# Patient Record
Sex: Female | Born: 1970 | Race: White | Hispanic: No | Smoking: Former smoker
Health system: Southern US, Community
[De-identification: ages and names within clinical notes are randomized; demographics above are authoritative.]

## PROBLEM LIST (undated history)

## (undated) DIAGNOSIS — G5600 Carpal tunnel syndrome, unspecified upper limb: Secondary | ICD-10-CM

## (undated) DIAGNOSIS — F419 Anxiety disorder, unspecified: Secondary | ICD-10-CM

## (undated) DIAGNOSIS — K219 Gastro-esophageal reflux disease without esophagitis: Secondary | ICD-10-CM

## (undated) HISTORY — PX: ESOPHAGEAL DILATION: SHX303

## (undated) HISTORY — DX: Carpal tunnel syndrome, unspecified upper limb: G56.00

## (undated) HISTORY — DX: Gastro-esophageal reflux disease without esophagitis: K21.9

## (undated) HISTORY — PX: LEEP: SHX91

## (undated) HISTORY — PX: ESOPHAGEAL MANOMETRY: SHX1526

## (undated) HISTORY — DX: Anxiety disorder, unspecified: F41.9

---

## 1998-11-19 ENCOUNTER — Inpatient Hospital Stay (HOSPITAL_COMMUNITY): Admission: AD | Admit: 1998-11-19 | Discharge: 1998-11-19 | Payer: Self-pay | Admitting: Obstetrics and Gynecology

## 1998-11-30 ENCOUNTER — Inpatient Hospital Stay (HOSPITAL_COMMUNITY): Admission: AD | Admit: 1998-11-30 | Discharge: 1998-12-02 | Payer: Self-pay | Admitting: Obstetrics and Gynecology

## 1998-11-30 ENCOUNTER — Encounter (INDEPENDENT_AMBULATORY_CARE_PROVIDER_SITE_OTHER): Payer: Self-pay

## 1999-01-05 ENCOUNTER — Other Ambulatory Visit: Admission: RE | Admit: 1999-01-05 | Discharge: 1999-01-05 | Payer: Self-pay | Admitting: Obstetrics and Gynecology

## 2000-01-30 ENCOUNTER — Other Ambulatory Visit: Admission: RE | Admit: 2000-01-30 | Discharge: 2000-01-30 | Payer: Self-pay | Admitting: Family Medicine

## 2001-06-28 ENCOUNTER — Other Ambulatory Visit: Admission: RE | Admit: 2001-06-28 | Discharge: 2001-06-28 | Payer: Self-pay | Admitting: Family Medicine

## 2001-08-05 ENCOUNTER — Encounter: Admission: RE | Admit: 2001-08-05 | Discharge: 2001-08-05 | Payer: Self-pay | Admitting: Family Medicine

## 2001-08-05 ENCOUNTER — Encounter: Payer: Self-pay | Admitting: Family Medicine

## 2003-02-27 ENCOUNTER — Other Ambulatory Visit: Admission: RE | Admit: 2003-02-27 | Discharge: 2003-02-27 | Payer: Self-pay | Admitting: Family Medicine

## 2003-08-02 ENCOUNTER — Inpatient Hospital Stay (HOSPITAL_COMMUNITY): Admission: AD | Admit: 2003-08-02 | Discharge: 2003-08-03 | Payer: Self-pay | Admitting: Obstetrics and Gynecology

## 2004-02-08 ENCOUNTER — Other Ambulatory Visit: Admission: RE | Admit: 2004-02-08 | Discharge: 2004-02-08 | Payer: Self-pay | Admitting: Family Medicine

## 2005-09-15 ENCOUNTER — Ambulatory Visit: Payer: Self-pay | Admitting: Family Medicine

## 2005-09-29 ENCOUNTER — Ambulatory Visit: Payer: Self-pay | Admitting: Family Medicine

## 2005-09-29 ENCOUNTER — Encounter: Payer: Self-pay | Admitting: Family Medicine

## 2005-09-29 ENCOUNTER — Other Ambulatory Visit: Admission: RE | Admit: 2005-09-29 | Discharge: 2005-09-29 | Payer: Self-pay | Admitting: Family Medicine

## 2005-12-21 ENCOUNTER — Ambulatory Visit: Payer: Self-pay | Admitting: Family Medicine

## 2005-12-22 ENCOUNTER — Ambulatory Visit: Payer: Self-pay | Admitting: Gastroenterology

## 2006-01-05 ENCOUNTER — Ambulatory Visit: Payer: Self-pay | Admitting: Family Medicine

## 2006-06-18 ENCOUNTER — Encounter: Payer: Self-pay | Admitting: Internal Medicine

## 2006-06-21 ENCOUNTER — Ambulatory Visit: Payer: Self-pay | Admitting: Internal Medicine

## 2006-10-05 ENCOUNTER — Encounter: Payer: Self-pay | Admitting: Family Medicine

## 2006-12-06 ENCOUNTER — Ambulatory Visit: Payer: Self-pay | Admitting: Family Medicine

## 2006-12-06 ENCOUNTER — Encounter: Payer: Self-pay | Admitting: Family Medicine

## 2006-12-06 ENCOUNTER — Other Ambulatory Visit: Admission: RE | Admit: 2006-12-06 | Discharge: 2006-12-06 | Payer: Self-pay | Admitting: Family Medicine

## 2006-12-06 DIAGNOSIS — F411 Generalized anxiety disorder: Secondary | ICD-10-CM | POA: Insufficient documentation

## 2006-12-06 DIAGNOSIS — G43009 Migraine without aura, not intractable, without status migrainosus: Secondary | ICD-10-CM | POA: Insufficient documentation

## 2006-12-06 DIAGNOSIS — K219 Gastro-esophageal reflux disease without esophagitis: Secondary | ICD-10-CM

## 2006-12-06 LAB — CONVERTED CEMR LAB
Bilirubin Urine: NEGATIVE
Glucose, Urine, Semiquant: NEGATIVE
Ketones, urine, test strip: NEGATIVE
Nitrite: NEGATIVE
Pap Smear: NORMAL
Protein, U semiquant: NEGATIVE
Specific Gravity, Urine: 1.015
Urobilinogen, UA: NEGATIVE
WBC Urine, dipstick: NEGATIVE
pH: 7.5

## 2006-12-13 LAB — CONVERTED CEMR LAB
ALT: 19 units/L (ref 0–35)
AST: 16 units/L (ref 0–37)
Albumin: 3.8 g/dL (ref 3.5–5.2)
Alkaline Phosphatase: 48 units/L (ref 39–117)
BUN: 12 mg/dL (ref 6–23)
Basophils Absolute: 0.1 10*3/uL (ref 0.0–0.1)
Basophils Relative: 0.8 % (ref 0.0–1.0)
Bilirubin, Direct: 0.1 mg/dL (ref 0.0–0.3)
CO2: 32 meq/L (ref 19–32)
Calcium: 9.1 mg/dL (ref 8.4–10.5)
Chloride: 104 meq/L (ref 96–112)
Cholesterol: 135 mg/dL (ref 0–200)
Creatinine, Ser: 0.8 mg/dL (ref 0.4–1.2)
Eosinophils Absolute: 0.2 10*3/uL (ref 0.0–0.6)
Eosinophils Relative: 2.9 % (ref 0.0–5.0)
GFR calc Af Amer: 104 mL/min
GFR calc non Af Amer: 86 mL/min
Glucose, Bld: 86 mg/dL (ref 70–99)
HCT: 34.3 % — ABNORMAL LOW (ref 36.0–46.0)
HDL: 41.5 mg/dL (ref >39.0)
Hemoglobin: 12 g/dL (ref 12.0–15.0)
LDL Cholesterol: 77 mg/dL (ref 0–99)
Lymphocytes Relative: 20.4 % (ref 12.0–46.0)
MCHC: 34.9 g/dL (ref 30.0–36.0)
MCV: 84.6 fL (ref 78.0–100.0)
Monocytes Absolute: 0.7 10*3/uL (ref 0.2–0.7)
Monocytes Relative: 8.7 % (ref 3.0–11.0)
Neutro Abs: 5.4 10*3/uL (ref 1.4–7.7)
Neutrophils Relative %: 67.2 % (ref 43.0–77.0)
Platelets: 316 10*3/uL (ref 150–400)
Potassium: 4.1 meq/L (ref 3.5–5.1)
RBC: 4.05 M/uL (ref 3.87–5.11)
RDW: 12.6 % (ref 11.5–14.6)
Sodium: 141 meq/L (ref 135–145)
TSH: 0.94 microintl units/mL (ref 0.35–5.50)
Total Bilirubin: 0.7 mg/dL (ref 0.3–1.2)
Total CHOL/HDL Ratio: 3.3
Total Protein: 6.7 g/dL (ref 6.0–8.3)
Triglycerides: 81 mg/dL (ref 0–149)
VLDL: 16 mg/dL (ref 0–40)
WBC: 8.1 10*3/uL (ref 4.5–10.5)

## 2007-01-28 ENCOUNTER — Telehealth (INDEPENDENT_AMBULATORY_CARE_PROVIDER_SITE_OTHER): Payer: Self-pay | Admitting: *Deleted

## 2007-01-28 ENCOUNTER — Ambulatory Visit: Payer: Self-pay | Admitting: Family Medicine

## 2007-01-28 DIAGNOSIS — R209 Unspecified disturbances of skin sensation: Secondary | ICD-10-CM | POA: Insufficient documentation

## 2007-01-30 ENCOUNTER — Encounter: Payer: Self-pay | Admitting: Family Medicine

## 2007-01-31 ENCOUNTER — Telehealth (INDEPENDENT_AMBULATORY_CARE_PROVIDER_SITE_OTHER): Payer: Self-pay | Admitting: *Deleted

## 2007-02-01 ENCOUNTER — Encounter: Payer: Self-pay | Admitting: Internal Medicine

## 2007-02-04 ENCOUNTER — Encounter: Payer: Self-pay | Admitting: Family Medicine

## 2007-05-01 ENCOUNTER — Encounter: Payer: Self-pay | Admitting: Internal Medicine

## 2007-05-02 ENCOUNTER — Ambulatory Visit: Payer: Self-pay | Admitting: Internal Medicine

## 2007-05-02 DIAGNOSIS — R079 Chest pain, unspecified: Secondary | ICD-10-CM | POA: Insufficient documentation

## 2007-05-02 DIAGNOSIS — G5600 Carpal tunnel syndrome, unspecified upper limb: Secondary | ICD-10-CM

## 2007-05-02 DIAGNOSIS — K219 Gastro-esophageal reflux disease without esophagitis: Secondary | ICD-10-CM

## 2007-05-02 DIAGNOSIS — G43909 Migraine, unspecified, not intractable, without status migrainosus: Secondary | ICD-10-CM | POA: Insufficient documentation

## 2007-05-03 LAB — CONVERTED CEMR LAB: Troponin I: 0.03 ng/mL (ref <0.06)

## 2007-05-04 LAB — CONVERTED CEMR LAB
CK-MB: 1.7 ng/mL (ref 0.3–4.0)
Total CK: 73 units/L (ref 7–177)

## 2007-05-21 ENCOUNTER — Ambulatory Visit: Payer: Self-pay | Admitting: Internal Medicine

## 2007-05-21 ENCOUNTER — Encounter (INDEPENDENT_AMBULATORY_CARE_PROVIDER_SITE_OTHER): Payer: Self-pay | Admitting: *Deleted

## 2007-07-04 ENCOUNTER — Telehealth (INDEPENDENT_AMBULATORY_CARE_PROVIDER_SITE_OTHER): Payer: Self-pay | Admitting: *Deleted

## 2007-11-08 ENCOUNTER — Ambulatory Visit: Payer: Self-pay | Admitting: *Deleted

## 2008-06-18 ENCOUNTER — Ambulatory Visit: Payer: Self-pay | Admitting: Family Medicine

## 2008-06-18 ENCOUNTER — Encounter: Payer: Self-pay | Admitting: Family Medicine

## 2008-06-18 ENCOUNTER — Other Ambulatory Visit: Admission: RE | Admit: 2008-06-18 | Discharge: 2008-06-18 | Payer: Self-pay | Admitting: Family Medicine

## 2008-06-18 LAB — CONVERTED CEMR LAB
Bilirubin Urine: NEGATIVE
Blood in Urine, dipstick: NEGATIVE
Glucose, Urine, Semiquant: NEGATIVE
Ketones, urine, test strip: NEGATIVE
Nitrite: NEGATIVE
Protein, U semiquant: NEGATIVE
Specific Gravity, Urine: <1.005
Urobilinogen, UA: NEGATIVE
WBC Urine, dipstick: NEGATIVE
pH: 7

## 2008-06-22 ENCOUNTER — Encounter (INDEPENDENT_AMBULATORY_CARE_PROVIDER_SITE_OTHER): Payer: Self-pay | Admitting: *Deleted

## 2008-06-22 ENCOUNTER — Telehealth: Payer: Self-pay | Admitting: Family Medicine

## 2008-06-22 LAB — CONVERTED CEMR LAB
ALT: 20 units/L (ref 0–35)
AST: 21 units/L (ref 0–37)
Albumin: 3.8 g/dL (ref 3.5–5.2)
Alkaline Phosphatase: 50 units/L (ref 39–117)
BUN: 12 mg/dL (ref 6–23)
Basophils Absolute: 0.1 10*3/uL (ref 0.0–0.1)
Basophils Relative: 1 % (ref 0.0–3.0)
Bilirubin, Direct: 0.1 mg/dL (ref 0.0–0.3)
CO2: 30 meq/L (ref 19–32)
Calcium: 8.8 mg/dL (ref 8.4–10.5)
Chloride: 105 meq/L (ref 96–112)
Cholesterol: 152 mg/dL (ref 0–200)
Creatinine, Ser: 0.9 mg/dL (ref 0.4–1.2)
Eosinophils Absolute: 0.2 10*3/uL (ref 0.0–0.7)
Eosinophils Relative: 3.4 % (ref 0.0–5.0)
GFR calc Af Amer: 90 mL/min
GFR calc non Af Amer: 74 mL/min
Glucose, Bld: 88 mg/dL (ref 70–99)
HCT: 30.6 % — ABNORMAL LOW (ref 36.0–46.0)
HDL: 55 mg/dL (ref >39.0)
Hemoglobin: 9.8 g/dL — ABNORMAL LOW (ref 12.0–15.0)
LDL Cholesterol: 87 mg/dL (ref 0–99)
Lymphocytes Relative: 22.7 % (ref 12.0–46.0)
MCHC: 32.2 g/dL (ref 30.0–36.0)
MCV: 74.6 fL — ABNORMAL LOW (ref 78.0–100.0)
Monocytes Absolute: 0.7 10*3/uL (ref 0.1–1.0)
Monocytes Relative: 12 % (ref 3.0–12.0)
Neutro Abs: 3.5 10*3/uL (ref 1.4–7.7)
Neutrophils Relative %: 60.9 % (ref 43.0–77.0)
Platelets: 269 10*3/uL (ref 150–400)
Potassium: 4.2 meq/L (ref 3.5–5.1)
RBC: 4.1 M/uL (ref 3.87–5.11)
RDW: 14.5 % (ref 11.5–14.6)
Sodium: 141 meq/L (ref 135–145)
TSH: 1.67 microintl units/mL (ref 0.35–5.50)
Total Bilirubin: 0.5 mg/dL (ref 0.3–1.2)
Total CHOL/HDL Ratio: 2.8
Total Protein: 6.7 g/dL (ref 6.0–8.3)
Triglycerides: 52 mg/dL (ref 0–149)
VLDL: 10 mg/dL (ref 0–40)
WBC: 5.8 10*3/uL (ref 4.5–10.5)

## 2008-09-15 ENCOUNTER — Telehealth (INDEPENDENT_AMBULATORY_CARE_PROVIDER_SITE_OTHER): Payer: Self-pay | Admitting: *Deleted

## 2008-12-01 ENCOUNTER — Telehealth: Payer: Self-pay | Admitting: Family Medicine

## 2008-12-03 ENCOUNTER — Ambulatory Visit: Payer: Self-pay | Admitting: Family Medicine

## 2008-12-03 DIAGNOSIS — D649 Anemia, unspecified: Secondary | ICD-10-CM

## 2008-12-09 ENCOUNTER — Encounter (INDEPENDENT_AMBULATORY_CARE_PROVIDER_SITE_OTHER): Payer: Self-pay | Admitting: *Deleted

## 2008-12-09 LAB — CONVERTED CEMR LAB
Basophils Relative: 1.4 % (ref 0.0–3.0)
Eosinophils Relative: 1.6 % (ref 0.0–5.0)
Ferritin: 7.7 ng/mL — ABNORMAL LOW (ref 10.0–291.0)
Hemoglobin: 12.6 g/dL (ref 12.0–15.0)
Lymphocytes Relative: 23.5 % (ref 12.0–46.0)
MCHC: 33.8 g/dL (ref 30.0–36.0)
Monocytes Relative: 10 % (ref 3.0–12.0)
Neutro Abs: 3.6 10*3/uL (ref 1.4–7.7)
RBC: 4.4 M/uL (ref 3.87–5.11)
Saturation Ratios: 16.2 % — ABNORMAL LOW (ref 20.0–50.0)
Transferrin: 251.2 mg/dL (ref 212.0–360.0)
WBC: 5.8 10*3/uL (ref 4.5–10.5)

## 2008-12-11 ENCOUNTER — Telehealth (INDEPENDENT_AMBULATORY_CARE_PROVIDER_SITE_OTHER): Payer: Self-pay | Admitting: *Deleted

## 2008-12-14 ENCOUNTER — Telehealth (INDEPENDENT_AMBULATORY_CARE_PROVIDER_SITE_OTHER): Payer: Self-pay | Admitting: *Deleted

## 2009-06-16 ENCOUNTER — Ambulatory Visit: Payer: Self-pay | Admitting: Family Medicine

## 2009-06-16 DIAGNOSIS — H10029 Other mucopurulent conjunctivitis, unspecified eye: Secondary | ICD-10-CM | POA: Insufficient documentation

## 2009-06-16 DIAGNOSIS — J019 Acute sinusitis, unspecified: Secondary | ICD-10-CM

## 2009-07-05 ENCOUNTER — Telehealth: Payer: Self-pay | Admitting: Family Medicine

## 2009-07-20 ENCOUNTER — Ambulatory Visit: Payer: Self-pay | Admitting: Family Medicine

## 2009-07-20 ENCOUNTER — Telehealth (INDEPENDENT_AMBULATORY_CARE_PROVIDER_SITE_OTHER): Payer: Self-pay | Admitting: *Deleted

## 2009-07-20 DIAGNOSIS — N920 Excessive and frequent menstruation with regular cycle: Secondary | ICD-10-CM | POA: Insufficient documentation

## 2009-07-20 DIAGNOSIS — J039 Acute tonsillitis, unspecified: Secondary | ICD-10-CM | POA: Insufficient documentation

## 2009-07-22 ENCOUNTER — Telehealth: Payer: Self-pay | Admitting: Family Medicine

## 2009-07-22 LAB — CONVERTED CEMR LAB
AST: 34 units/L (ref 0–37)
Albumin: 3.9 g/dL (ref 3.5–5.2)
Alkaline Phosphatase: 57 units/L (ref 39–117)
BUN: 8 mg/dL (ref 6–23)
Basophils Relative: 0.8 % (ref 0.0–3.0)
Bilirubin, Direct: 0.1 mg/dL (ref 0.0–0.3)
CO2: 31 meq/L (ref 19–32)
Calcium: 9.1 mg/dL (ref 8.4–10.5)
Chloride: 100 meq/L (ref 96–112)
Creatinine, Ser: 0.7 mg/dL (ref 0.4–1.2)
Hemoglobin: 12.8 g/dL (ref 12.0–15.0)
Lymphocytes Relative: 43 % (ref 12.0–46.0)
Mono Screen: NEGATIVE
Monocytes Relative: 10.8 % (ref 3.0–12.0)
Neutro Abs: 1.7 10*3/uL (ref 1.4–7.7)
Neutrophils Relative %: 43.2 % (ref 43.0–77.0)
RBC: 4.39 M/uL (ref 3.87–5.11)
Total Protein: 7.4 g/dL (ref 6.0–8.3)
WBC: 4 10*3/uL — ABNORMAL LOW (ref 4.5–10.5)

## 2009-09-30 ENCOUNTER — Telehealth (INDEPENDENT_AMBULATORY_CARE_PROVIDER_SITE_OTHER): Payer: Self-pay | Admitting: *Deleted

## 2009-09-30 ENCOUNTER — Ambulatory Visit: Payer: Self-pay | Admitting: Family Medicine

## 2009-09-30 ENCOUNTER — Encounter (INDEPENDENT_AMBULATORY_CARE_PROVIDER_SITE_OTHER): Payer: Self-pay | Admitting: *Deleted

## 2009-09-30 ENCOUNTER — Ambulatory Visit: Payer: Self-pay | Admitting: Cardiology

## 2009-09-30 DIAGNOSIS — R51 Headache: Secondary | ICD-10-CM

## 2009-09-30 DIAGNOSIS — R519 Headache, unspecified: Secondary | ICD-10-CM | POA: Insufficient documentation

## 2009-10-02 ENCOUNTER — Encounter: Payer: Self-pay | Admitting: Family Medicine

## 2009-10-04 LAB — CONVERTED CEMR LAB
Basophils Absolute: 0 10*3/uL (ref 0.0–0.1)
CO2: 30 meq/L (ref 19–32)
Calcium: 9.3 mg/dL (ref 8.4–10.5)
Creatinine, Ser: 0.7 mg/dL (ref 0.4–1.2)
Eosinophils Absolute: 0 10*3/uL (ref 0.0–0.7)
GFR calc non Af Amer: 105.17 mL/min (ref >60)
Glucose, Bld: 100 mg/dL — ABNORMAL HIGH (ref 70–99)
Hemoglobin: 13 g/dL (ref 12.0–15.0)
Lymphocytes Relative: 26.9 % (ref 12.0–46.0)
MCHC: 34 g/dL (ref 30.0–36.0)
Monocytes Relative: 13.1 % — ABNORMAL HIGH (ref 3.0–12.0)
Neutro Abs: 2.5 10*3/uL (ref 1.4–7.7)
Neutrophils Relative %: 59 % (ref 43.0–77.0)
RDW: 14.1 % (ref 11.5–14.6)
Sed Rate: 16 mm/hr (ref 0–22)
Sodium: 142 meq/L (ref 135–145)

## 2009-10-07 ENCOUNTER — Telehealth: Payer: Self-pay | Admitting: Family Medicine

## 2010-05-08 ENCOUNTER — Encounter: Payer: Self-pay | Admitting: Family Medicine

## 2010-05-19 NOTE — Progress Notes (Signed)
Summary: lmom 6/17  Phone Note From Other Clinic   Caller: Rose --CT Call For: lowne Summary of Call: CT head--sphenoid sinusitis,  no acute intracranial abnormality-- Initial call taken by: Loreen Freud DO,  September 30, 2009 5:25 PM  Follow-up for Phone Call        augmentin sent to pharmacy--- pt instructed to go to ER if she got no relief with vicodin--pt understands. Follow-up by: Loreen Freud DO,  September 30, 2009 5:26 PM  Additional Follow-up for Phone Call Additional follow up Details #1::        Please call pt tomorrow---late morning , early afternoon---to see how she is feeling. Additional Follow-up by: Loreen Freud DO,  September 30, 2009 5:26 PM    Additional Follow-up for Phone Call Additional follow up Details #2::    Called pt to check on her, had to leave a message. Army Fossa CMA  October 01, 2009 1:30 PM

## 2010-05-19 NOTE — Letter (Signed)
Summary: Call a Nurse  Call a Nurse   Imported By: Lanelle Bal 10/14/2009 13:52:49  _____________________________________________________________________  External Attachment:    Type:   Image     Comment:   External Document

## 2010-05-19 NOTE — Assessment & Plan Note (Signed)
Summary: SORE THROAT/KDC   Vital Signs:  Patient profile:   40 year old female Weight:      195 pounds Temp:     98.4 degrees F oral Pulse rate:   87 / minute Pulse rhythm:   regular BP sitting:   124 / 80  (left arm) Cuff size:   regular  Vitals Entered By: Army Fossa CMA (July 20, 2009 3:32 PM) CC: Pt here for sore throat, was seen a month ago and given an ATB, and now the sore thorat has returned., URI symptoms   History of Present Illness:       This is a 40 year old woman who presents with URI symptoms.  The symptoms began 4 weeks ago.  Pt did fell better after last tx for 3 days and than everything came back.  The patient complains of sore throat, but denies nasal congestion, clear nasal discharge, purulent nasal discharge, dry cough, productive cough, earache, and sick contacts.  The patient denies fever, low-grade fever (<100.5 degrees), fever of 100.5-103 degrees, fever of 103.1-104 degrees, fever to >104 degrees, stiff neck, dyspnea, wheezing, rash, vomiting, diarrhea, use of an antipyretic, and response to antipyretic.  The patient also reports headache.  The patient denies itchy watery eyes, itchy throat, sneezing, seasonal symptoms, response to antihistamine, muscle aches, and severe fatigue.  The patient denies the following risk factors for Strep sinusitis: unilateral facial pain, unilateral nasal discharge, poor response to decongestant, double sickening, tooth pain, Strep exposure, tender adenopathy, and absence of cough.     Pt also c/o heavy periods that seem to be getting worse.  She has always had heavy periods and was on bcp before she had children because of it.  Current Medications (verified): 1)  Nexium 40 Mg  Cpdr (Esomeprazole Magnesium) .Marland Kitchen.. 1 By Mouth Two Times A Day 2)  Adult Aspirin Ec Low Strength 81 Mg Tbec (Aspirin) 3)  Nortriptyline Hcl 50 Mg Caps (Nortriptyline Hcl) .Marland Kitchen.. 1 By Mouth Two Times A Day 4)  Clonazepam 0.5 Mg Tbdp (Clonazepam) .Marland Kitchen.. 1 By  Mouth Once Daily 5)  Mvi 6)  Nasonex 50 Mcg/act Susp (Mometasone Furoate) .... 2 Sprays Each Nostril Once Daily  Allergies: 1)  ! 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  Otoscopic examination reveals clear canals, tympanic membranes are intact bilaterally without bulging, retraction, inflammation or discharge. Hearing is grossly normal bilaterally. Nose:  External nasal examination shows no deformity or inflammation. Nasal mucosa are pink and moist without lesions or exudates. Mouth:  pharyngeal erythema.   + enlarged tonsils Neck:  No deformities, masses, or tenderness noted. Lungs:  Normal respiratory effort, chest expands symmetrically. Lungs are clear to auscultation, no crackles or wheezes. Heart:  Normal rate and  regular rhythm. S1 and S2 normal without gallop, murmur, click, rub or other extra sounds. Neurologic:  No cranial nerve deficits noted. Station and gait are normal. Plantar reflexes  are down-going bilaterally. DTRs are symmetrical throughout. Sensory, motor and coordinative functions appear intact. Skin:  Intact without suspicious lesions or rashes + slapped cheek Cervical Nodes:  No lymphadenopathy noted Psych:  Cognition and judgment appear intact. Alert and cooperative with normal attention span and concentration. No apparent delusions, illusions, hallucinations   Impression & Recommendations:  Problem # 1:  ACUTE TONSILLITIS (ICD-463) ceftin for 10 days depo medrol ENT if no better Orders: Venipuncture (16109) TLB-CBC Platelet - w/Differential (85025-CBCD) TLB-Hepatic/Liver Function Pnl (80076-HEPATIC) TLB-BMP (Basic Metabolic Panel-BMET) (80048-METABOL) TLB-Mono Test (Monospot) (60454-UJWJ) Rapid Strep (19147)  Problem # 2:  EXCESSIVE MENSTRUAL BLEEDING (ICD-626.2)  Orders: Radiology Referral (Radiology) Rapid Strep (82956)  Discussed medication use.   Complete Medication List: 1)  Nexium 40 Mg Cpdr (Esomeprazole magnesium) .Marland Kitchen.. 1 by mouth two times a day 2)  Adult Aspirin Ec Low Strength 81 Mg Tbec (Aspirin) 3)  Nortriptyline Hcl 50 Mg Caps (Nortriptyline hcl) .Marland Kitchen.. 1 by mouth two times a day 4)  Clonazepam 0.5 Mg Tbdp (Clonazepam) .Marland Kitchen.. 1 by mouth once daily 5)  Mvi  6)  Nasonex 50 Mcg/act Susp (Mometasone furoate) .... 2 sprays each nostril once daily 7)  Zyrtec Allergy 10 Mg Tabs (Cetirizine hcl) .Marland Kitchen.. 1 by mouth once daily 8)  Ceftin 500 Mg Tabs (Cefuroxime axetil) .Marland Kitchen.. 1 by mouth two times a day Prescriptions: CEFTIN 500 MG TABS (CEFUROXIME AXETIL) 1 by mouth two times a day  #20 x 0   Entered and Authorized by:   Loreen Freud DO   Signed by:   Loreen Freud DO on 07/20/2009   Method used:   Electronically to        [id number]* (retail)       [address]       [place], [place]  [address]       Ph: [phone] or [phone]       Fax: [phone]   RxID:   [id number]   Appended Document:  SORE THROAT/KDC    Clinical Lists Changes  Orders: Added new Service order of Admin of Therapeutic Inj  intramuscular or subcutaneous (43329) - Signed Added new Service order of Depo- Medrol 80mg  (J1040) - Signed       Medication Administration  Injection # 1:    Medication: Depo- Medrol 80mg     Diagnosis: ACUTE TONSILLITIS (ICD-463)    Route: IM    Site: RUOQ gluteus    Exp Date: 02/2010    Lot #: [id number]    Mfr: novaplus    Patient tolerated injection without complications    Given by: Army Fossa CMA (July 20, 2009 4:14 PM)  Orders Added: 1)  Admin of Therapeutic Inj  intramuscular or subcutaneous [96372] 2)  Depo- Medrol 80mg  [J1040]

## 2010-05-19 NOTE — Letter (Signed)
Summary: Out of Work  [facility]  [address] [place], [place] [address]   Phone: [phone]  Fax: [phone]    June 16, 2009   Employee:  Alexis Zamora    To Whom It May Concern:   For Medical reasons, please excuse the above named employee from work for the following dates:  Start:   June 16, 2009  End:   March 2 , 2011  If you need additional information, please feel free to contact our office.         Sincerely,    Loreen Freud DO

## 2010-05-19 NOTE — Assessment & Plan Note (Signed)
Summary: pinkeye/kdc   Vital Signs:  Patient profile:   40 year old female Height:      65.5 inches Weight:      195 pounds BMI:     32.07 Temp:     98.5 degrees F oral Pulse rate:   85 / minute Pulse rhythm:   regular BP sitting:   122 / 80  (left arm) Cuff size:   regular  Vitals Entered By: Army Fossa CMA (June 16, 2009 11:54 AM) CC: Pt c/o coughing up blood, pressure in face and teeth, woke up this am with her eye matted shut. , URI symptoms   History of Present Illness:       This is a 40 year old woman who presents with URI symptoms.  Pt woke up this am with R eye glued shut.  The patient complains of nasal congestion and purulent nasal discharge, but denies clear nasal discharge, sore throat, dry cough, productive cough, earache, and sick contacts.  The patient denies fever, low-grade fever (<100.5 degrees), fever of 100.5-103 degrees, fever of 103.1-104 degrees, fever to >104 degrees, stiff neck, dyspnea, wheezing, rash, vomiting, diarrhea, use of an antipyretic, and response to antipyretic.  The patient also reports itchy watery eyes.  The patient denies itchy throat, sneezing, seasonal symptoms, response to antihistamine, headache, muscle aches, and severe fatigue.  The patient denies the following risk factors for Strep sinusitis: unilateral facial pain, unilateral nasal discharge, poor response to decongestant, double sickening, tooth pain, Strep exposure, tender adenopathy, and absence of cough.    Current Medications (verified): 1)  Nexium 40 Mg  Cpdr (Esomeprazole Magnesium) .Marland Kitchen.. 1 By Mouth Once Daily 2)  Adult Aspirin Ec Low Strength 81 Mg Tbec (Aspirin) 3)  Nortriptyline Hcl 50 Mg Caps (Nortriptyline Hcl) .Marland Kitchen.. 1 By Mouth Two Times A Day 4)  Clonazepam 0.5 Mg Tbdp (Clonazepam) .Marland Kitchen.. 1 By Mouth Once Daily 5)  Mvi 6)  Augmentin 875-125 Mg Tabs (Amoxicillin-Pot Clavulanate) .Marland Kitchen.. 1 By Mouth Two Times A Day 7)  Nasonex 50 Mcg/act Susp (Mometasone Furoate) .... 2 Sprays  Each Nostril Once Daily  Allergies: 1)  ! Codeine  Past History:  Past medical, surgical, family and social histories (including risk factors) reviewed for relevance to current acute and chronic problems.  Past Medical History: Reviewed history from 11/08/2007 and no changes required. CARPAL TUNNEL SYNDROME (ICD-354.0) COMMON MIGRAINE (ICD-346.10) ANXIETY (ICD-300.00) GERD (ICD-530.81) frequent UTI  Past Surgical History: Reviewed history from 06/18/2008 and no changes required. LEEP esophageal dilatation manometry--Dr [facility]  Family History: Reviewed history from 06/18/2008 and no changes required. Family History Diabetes 1st degree relative Family History High cholesterol Family History Hypertension MGF- VT, MI MGGM- colon Ca Mother- seeing rheum for possible Lupus  Social History: Reviewed history from 12/06/2006 and no changes required. 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  R eye Ears:  External ear exam shows no significant lesions or deformities.  Otoscopic examination reveals clear canals, tympanic membranes are intact bilaterally without bulging, retraction, inflammation or discharge. Hearing is grossly normal bilaterally. Nose:  L frontal sinus tenderness, L maxillary sinus tenderness, R frontal sinus tenderness, and R maxillary sinus tenderness.   Mouth:  Oral mucosa and oropharynx without lesions or exudates.  Teeth in good repair.  Neck:  No deformities, masses, or tenderness noted. Lungs:  Normal respiratory effort, chest expands symmetrically. Lungs are clear to auscultation, no crackles or wheezes. Heart:  Normal rate and regular rhythm. S1 and S2 normal  without gallop, murmur, click, rub or other extra sounds. Psych:  Cognition and judgment appear intact. Alert and cooperative with normal attention span and concentration. No apparent delusions, illusions, hallucinations   Impression & Recommendations:  Problem # 1:  SINUSITIS - ACUTE-NOS (ICD-461.9)  Her updated medication list for this problem includes:    Augmentin 875-125 Mg Tabs (Amoxicillin-pot clavulanate) .Marland Kitchen... 1 by mouth two times a day    Nasonex 50 Mcg/act Susp (Mometasone furoate) .Marland Kitchen... 2 sprays each nostril once daily  Instructed on treatment. Call if symptoms persist or worsen.   Problem # 2:  CONJUNCTIVITIS, BACTERIAL (ICD-372.03)  Discussed treatment, and urged patient to wash hands carefully after touching face.   Complete Medication List: 1)  Nexium 40 Mg Cpdr (Esomeprazole magnesium) .Marland Kitchen.. 1 by mouth once daily 2)  Adult Aspirin Ec Low Strength 81 Mg Tbec (Aspirin) 3)  Nortriptyline Hcl 50 Mg Caps (Nortriptyline hcl) .Marland Kitchen.. 1 by mouth two times a day 4)  Clonazepam 0.5 Mg Tbdp (Clonazepam) .Marland Kitchen.. 1 by mouth once daily 5)  Mvi  6)  Augmentin 875-125 Mg Tabs (Amoxicillin-pot clavulanate) .Marland Kitchen.. 1 by mouth two times a day 7)  Nasonex 50 Mcg/act Susp (Mometasone furoate) .... 2 sprays each nostril once daily Prescriptions: NASONEX 50 MCG/ACT SUSP (MOMETASONE FUROATE) 2 sprays each nostril once daily  #1 x 0   Entered and Authorized by:   Loreen Freud DO   Signed by:   Loreen Freud DO on 06/16/2009   Method used:   Historical   RxID:   [id number] AUGMENTIN 875-125 MG TABS (AMOXICILLIN-POT CLAVULANATE) 1 by mouth two times a day  #20 x 0   Entered and Authorized by:   Loreen Freud DO   Signed by:   Loreen Freud DO on 06/16/2009   Method used:   Electronically to        [id number]* (retail)       [address]       [place], [place]  [address]       Ph: [phone] or [phone]       Fax: [phone]   RxID:    [id number]

## 2010-05-19 NOTE — Progress Notes (Signed)
Summary: FAILED RADIOLOGY REFERRAL  Phone Note Other Incoming   Summary of Call: FYI.......[facility].  PATIENT CALLED AND DUE TO COST AFTER HER INSURANCE PAYS, SHE IS DECLINING TO HAVE PELVIC U/S AT THIS TIME. Initial call taken by: Magdalen Spatz [facility],  July 22, 2009 2:56 PM  Follow-up for Phone Call        Per Dr.Lowne- I called to see if pt would like to see a GYN. LMTCB. Army Fossa CMA  July 22, 2009 4:47 PM   Additional Follow-up for Phone Call Additional follow up Details #1::        would pt like to see gyn ? Additional Follow-up by: Loreen Freud DO,  July 22, 2009 4:55 PM    Additional Follow-up for Phone Call Additional follow up Details #2::    Left message to call back. Army Fossa CMA  July 23, 2009 9:45 AM   Additional Follow-up for Phone Call Additional follow up Details #3:: Details for Additional Follow-up Action Taken: Patient used to see Dr. Adalberto Ill at [facility].  She needs a referral back to there. Patient would need to morning appt, has to be after April 28.  Additional Follow-up by: Harold Barban,  July 23, 2009 1:50 PM

## 2010-05-19 NOTE — Progress Notes (Signed)
Summary: ? for asst.  Phone Note Call from Patient Call back at Work Phone [phone]   Caller: Patient Call For: Loreen Freud DO Summary of Call: Patient wants to know if there was any medication called in for her.  She is confused and needs clarification.  Please Call. Initial call taken by: Barnie Mort,  July 20, 2009 4:32 PM  Follow-up for Phone Call        Pt is aware meds were called in. Army Fossa CMA  July 20, 2009 4:38 PM

## 2010-05-19 NOTE — Progress Notes (Signed)
Summary: Call-a-nurse triage (lmom 6/23,6/24)  Phone Note Call from Patient   Summary of Call: Call a Nurse report-  Pt called on 10/02/09  Reason for Call- LMP now. Pt sts that she has a HA onset 1 week. Rates pain 8/10. Pt states that she has a pink rash on her back. Pt stats she was told to call back in no better by today. Denies urgent emergent signs. Care adv given. Adv UC or ER.   I called pt and left a message to see if she was seen that day, and if her symptoms were better. Army Fossa CMA  October 07, 2009 8:51 AM   Follow-up for Phone Call        Pt never called back. Army Fossa CMA  October 12, 2009 8:41 AM   Additional Follow-up for Phone Call Additional follow up Details #1::        check to see if she was admitted to hospital---Please Additional Follow-up by: Loreen Freud DO,  October 12, 2009 9:04 AM    Additional Follow-up for Phone Call Additional follow up Details #2::    left message to call back.Army Fossa CMA  October 08, 2009 2:20 PM   Additional Follow-up for Phone Call Additional follow up Details #3:: Details for Additional Follow-up Action Taken: There is nothing in E-chart when i checked. Army Fossa CMA  October 12, 2009 9:34 AM

## 2010-05-19 NOTE — Miscellaneous (Signed)
Summary: Orders Update  Clinical Lists Changes  Medications: Added new medication of AUGMENTIN 875-125 MG TABS (AMOXICILLIN-POT CLAVULANATE) 1 by mouth two times a day - Signed Rx of AUGMENTIN 875-125 MG TABS (AMOXICILLIN-POT CLAVULANATE) 1 by mouth two times a day;  #20 x 0;  Signed;  Entered by: Loreen Freud DO;  Authorized by: Loreen Freud DO;  Method used: Electronically to [facility]. # [id number]*, [address] [place], [place], [place]  [address], Ph: [phone] or [phone], Fax: [phone]    Prescriptions: AUGMENTIN 875-125 MG TABS (AMOXICILLIN-POT CLAVULANATE) 1 by mouth two times a day  #20 x 0   Entered and Authorized by:   Loreen Freud DO   Signed by:   Loreen Freud DO on 09/30/2009   Method used:   Electronically to        [id number]* (retail)       [address]       [place], [place]  [address]       Ph: [phone] or [phone]       Fax: [phone]   RxID:   [id number]

## 2010-05-19 NOTE — Assessment & Plan Note (Signed)
Summary: MIGRAINES AND NAUSEA/CDJ   Vital Signs:  Patient profile:   40 year old female Weight:      192.50 pounds Pulse rate:   94 / minute Pulse rhythm:   regular BP sitting:   132 / 80  (left arm) Cuff size:   regular  Vitals Entered By: Army Fossa CMA (September 30, 2009 3:42 PM) CC: Pt here with a severe HA that started saturday am, Headaches   History of Present Illness:  Headaches      This is a 40 year old woman who presents with Headaches.  The symptoms began 1 week ago.  Pt states Ib was helping but yesterday it did not touch it-- she started seeing kalidescope colors.  She took a tramadol and it did not help.  She woke up at 1am buring up but did not check fever.  Pt had n/v early am.  THis headache is the worst one she has ever had.  The patient complains of nausea, vomiting, sweats, and photophobia, but denies tearing of eyes, nasal congestion, sinus pain, sinus pressure, and phonophobia.  The headache is described as constant and sharp.  High-risk features (red flags) include fever and vision loss or change.  The patient denies the following high-risk features: neck pain/stiffness, focal weakness, altered mental status, rash, trauma, pain worse with exertion, new type of headache, age >50 years, immunosuppression, concomitant infection, and anticoagulation use.  The headaches are precipitated by menses.  Prior treatment has included a NSAID and acetaminophen.    Current Medications (verified): 1)  Nexium 40 Mg  Cpdr (Esomeprazole Magnesium) .Marland Kitchen.. 1 By Mouth Once Daily 2)  Adult Aspirin Ec Low Strength 81 Mg Tbec (Aspirin) 3)  Nortriptyline Hcl 50 Mg Caps (Nortriptyline Hcl) .Marland Kitchen.. 1 By Mouth Two Times A Day 4)  Clonazepam 0.5 Mg Tbdp (Clonazepam) .Marland Kitchen.. 1 By Mouth Once Daily 5)  Mvi 6)  Nasonex 50 Mcg/act Susp (Mometasone Furoate) .... 2 Sprays Each Nostril Once Daily 7)  Zyrtec Allergy 10 Mg Tabs (Cetirizine Hcl) .Marland Kitchen.. 1 By Mouth Once Daily 8)  Vicodin Es 7.5-750 Mg Tabs  (Hydrocodone-Acetaminophen) .Marland Kitchen.. 1 By Mouth Q6h As Needed 9)  Promethazine Hcl 25 Mg Tabs (Promethazine Hcl) .Marland Kitchen.. 1 By Mouth Three Times A Day As Needed Nausea  Allergies: 1)  ! Codeine  Past History:  Past medical, surgical, family and social histories (including risk factors) reviewed for relevance to current acute and chronic problems.  Past Medical History: Reviewed history from 11/08/2007 and no changes required. CARPAL TUNNEL SYNDROME (ICD-354.0) COMMON MIGRAINE (ICD-346.10) ANXIETY (ICD-300.00) GERD (ICD-530.81) frequent UTI  Past Surgical History: Reviewed history from 06/18/2008 and no changes required. LEEP esophageal dilatation manometry--Dr [facility]  Family History: Reviewed history from 06/18/2008 and no changes required. Family History Diabetes 1st degree relative Family History High cholesterol Family History Hypertension MGF- [place], [place] MGGM- colon Ca Mother- seeing rheum for possible Lupus  Social History: Reviewed history from 12/06/2006 and no changes required. 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   Ears:  External ear exam shows no significant lesions or deformities.  Otoscopic examination reveals clear canals, tympanic membranes are intact bilaterally without bulging, retraction, inflammation or discharge. Hearing is grossly normal bilaterally. Mouth:  Oral  mucosa and oropharynx without lesions or exudates.  Teeth in good repair. Neck:  No deformities, masses, or tenderness noted.supple, full ROM, no cervical lymphadenopathy, and no neck tenderness.   Lungs:  Normal respiratory effort, chest expands symmetrically. Lungs are clear to  auscultation, no crackles or wheezes. Heart:  Normal rate and regular rhythm. S1 and S2 normal without gallop, murmur, click, rub or other extra sounds. Msk:  normal ROM.   Neurologic:  alert & oriented X3, cranial nerves II-XII intact, strength normal in all extremities, gait normal, and DTRs symmetrical and normal.   Skin:  Intact without suspicious lesions or rashes Cervical Nodes:  No lymphadenopathy noted Psych:  Oriented X3, normally interactive, good eye contact, not anxious appearing, and not depressed appearing.     Impression & Recommendations:  Problem # 1:  MIGRAINE, CHRONIC (ICD-346.90) Assessment Deteriorated  Her updated medication list for this problem includes:    Adult Aspirin Ec Low Strength 81 Mg Tbec (Aspirin)    Vicodin Es 7.5-750 Mg Tabs (Hydrocodone-acetaminophen) .Marland Kitchen... 1 by mouth q6h as needed  Orders: Venipuncture (40981) TLB-BMP (Basic Metabolic Panel-BMET) (80048-METABOL) TLB-CBC Platelet - w/Differential (85025-CBCD) TLB-Sedimentation Rate (ESR) (85652-ESR) Radiology Referral (Radiology)  Complete Medication List: 1)  Nexium 40 Mg Cpdr (Esomeprazole magnesium) .Marland Kitchen.. 1 by mouth once daily 2)  Adult Aspirin Ec Low Strength 81 Mg Tbec (Aspirin) 3)  Nortriptyline Hcl 50 Mg Caps (Nortriptyline hcl) .Marland Kitchen.. 1 by mouth two times a day 4)  Clonazepam 0.5 Mg Tbdp (Clonazepam) .Marland Kitchen.. 1 by mouth once daily 5)  Mvi  6)  Nasonex 50 Mcg/act Susp (Mometasone furoate) .... 2 sprays each nostril once daily 7)  Zyrtec Allergy 10 Mg Tabs (Cetirizine hcl) .Marland Kitchen.. 1 by mouth once daily 8)  Vicodin Es 7.5-750 Mg Tabs (Hydrocodone-acetaminophen) .Marland Kitchen.. 1 by mouth q6h as needed 9)  Promethazine Hcl 25 Mg Tabs (Promethazine hcl) .Marland Kitchen.. 1 by mouth three times a day as needed nausea  Other Orders: Admin of Therapeutic Inj  intramuscular or subcutaneous (19147)  Patient Instructions: 1)  if symptoms worsen---or no better with pain med --go to ER!!  Prescriptions: PROMETHAZINE HCL 25 MG  TABS (PROMETHAZINE HCL) 1 by mouth three times a day as needed nausea  #30 x 0   Entered and Authorized by:   Loreen Freud DO   Signed by:   Loreen Freud DO on 09/30/2009   Method used:   Print then Give to Patient   RxID:   [id number] VICODIN ES 7.5-750 MG TABS (HYDROCODONE-ACETAMINOPHEN) 1 by mouth q6h as needed  #30 x 0   Entered and Authorized by:   Loreen Freud DO   Signed by:   Loreen Freud DO on 09/30/2009   Method used:   Print then Give to Patient   RxID:   [id number]    Medication Administration  Injection # 1:    Medication: Ketorolac-Toradol 15mg     Diagnosis: HEADACHE (ICD-784.0)    Route: IM    Site: RUOQ gluteus    Exp Date: 05/18/2010    Lot #: [id number]    Mfr: [email or web address]    Patient tolerated injection without complications    Given by: Army Fossa CMA (September 30, 2009 3:59 PM)  Orders Added: 1)  Venipuncture [44034] 2)  TLB-BMP (Basic Metabolic Panel-BMET) [80048-METABOL] 3)  TLB-CBC Platelet - w/Differential [85025-CBCD] 4)  TLB-Sedimentation Rate (ESR) [85652-ESR] 5)  Admin of Therapeutic Inj  intramuscular or subcutaneous [96372] 6)  Radiology Referral [Radiology] 7)  Est. Patient Level III [74259]

## 2010-05-19 NOTE — Letter (Signed)
Summary: Out of Work  [facility] [employer]  [address] [place], [place] [address]   Phone: [phone]  Fax: [phone]    September 30, 2009   Employee:  GAYTHA RAYBOURN Bryars    To Whom It May Concern:   For Medical reasons, please excuse the above named employee from work for the following dates:  Start:   September 30, 2009  End:   October 01, 2009  If you need additional information, please feel free to contact our office.         Sincerely,    Loreen Freud, DO

## 2010-05-19 NOTE — Progress Notes (Signed)
Summary: migrane  Phone Note Call from Patient Call back at Home Phone [phone]   Caller: Patient Summary of Call: patient has had headache (migrane) 5 days -  she takes nortriptaline not helping - started throwing up  Initial call taken by: Okey Regal [place],  September 30, 2009 11:34 AM  Follow-up for Phone Call        spoke w/ patient hx of migraines this is just lasting longer than usual  has been following w/ neuro medication change but hasn't been working has taking ibruprofen w/ some relief not having and facial weakness or numbness and tingling appt scheduled to see Dr. Laury Axon.......Marland KitchenDoristine Devoid  September 30, 2009 11:44 AM

## 2010-05-19 NOTE — Progress Notes (Signed)
Summary:  fyi call a nurse  Phone Note Outgoing Call   Summary of Call: [facility] Triage Call Report Triage Record Num: [id number] Operator: Freddie Breech Patient Name: Alexis Zamora Call Date & Time: 07/04/2009 10:22:28AM Patient Phone: [phone] PCP: Lelon Perla Patient Gender: Female PCP Fax : Patient DOB: [birth year] Practice Name: [facility] Reason for Call: LMP 07/02/09. C/o yellow eye drng from [facility] onset today. Completed Amox for pink eye and sinus infection x 3 wks ago. RN called in Polytrim eye gtts 2 gtts ou qid x 5 d to [facility] [address] [phone]. Dr. Caryl Never on call. Chanda Busing verifies no drug interactions. Protocol(s) Used: Eye: Infection / Irritation Recommended Outcome per Protocol: See Provider within 24 hours Reason for Outcome: New onset of eye redness, irritation/foreign body sensation or gritty feeling with yellow/green drainage Care Advice: Call provider if symptoms get worse, or you develop increasing eye pain, changes in vision, or blisters or sores on eye or insides of eyelids.  ~ SEE PROVIDER WITHIN 4 HOURS if eyelid or area surrounding eye is red, warm, or tender.  ~  ~ Discard mascara and eye liner as they can be a source of infection. Do not share eye cosmetics.  ~ Don't use eye makeup or wear contact lenses until there have been no symptoms for at least 24 hours. 03/  Follow-up for Phone Call        pt states that eyes are much better. pt denies any drainage or matting this am. pt does still c/o redness in both eyes. Pt has been applying drops that was rx and has had some relief. Pt offer OV to come in pt decline stating that she would like to see how she feels today and if no better she will come in for OV tomorrow..pt advise if symptoms get worse or increase in pain,vision changes she need to be seen in ED or UC................Marland KitchenFelecia Deloach CMA  July 05, 2009 8:32 AM

## 2010-06-16 ENCOUNTER — Encounter (INDEPENDENT_AMBULATORY_CARE_PROVIDER_SITE_OTHER): Payer: Commercial Managed Care - PPO | Admitting: Family Medicine

## 2010-06-16 ENCOUNTER — Encounter: Payer: Self-pay | Admitting: Family Medicine

## 2010-06-16 ENCOUNTER — Other Ambulatory Visit: Payer: Self-pay | Admitting: Family Medicine

## 2010-06-16 DIAGNOSIS — K921 Melena: Secondary | ICD-10-CM | POA: Insufficient documentation

## 2010-06-16 DIAGNOSIS — K219 Gastro-esophageal reflux disease without esophagitis: Secondary | ICD-10-CM

## 2010-06-16 DIAGNOSIS — F411 Generalized anxiety disorder: Secondary | ICD-10-CM

## 2010-06-16 DIAGNOSIS — Z0000 Encounter for general adult medical examination without abnormal findings: Secondary | ICD-10-CM

## 2010-06-16 DIAGNOSIS — D649 Anemia, unspecified: Secondary | ICD-10-CM

## 2010-06-16 LAB — CBC WITH DIFFERENTIAL/PLATELET
Basophils Relative: 0.4 % (ref 0.0–3.0)
Eosinophils Absolute: 0.1 10*3/uL (ref 0.0–0.7)
MCHC: 34 g/dL (ref 30.0–36.0)
MCV: 87.9 fl (ref 78.0–100.0)
Monocytes Absolute: 0.7 10*3/uL (ref 0.1–1.0)
Neutro Abs: 5.2 10*3/uL (ref 1.4–7.7)
Neutrophils Relative %: 64.3 % (ref 43.0–77.0)
RBC: 4.84 Mil/uL (ref 3.87–5.11)
RDW: 14 % (ref 11.5–14.6)

## 2010-06-16 LAB — LIPID PANEL
Cholesterol: 185 mg/dL (ref 0–200)
Total CHOL/HDL Ratio: 3
Triglycerides: 104 mg/dL (ref 0.0–149.0)

## 2010-06-16 LAB — IBC PANEL
Iron: 55 ug/dL (ref 42–145)
Transferrin: 274.9 mg/dL (ref 212.0–360.0)

## 2010-06-16 LAB — CONVERTED CEMR LAB
Nitrite: NEGATIVE
Protein, U semiquant: NEGATIVE
Urobilinogen, UA: NEGATIVE
WBC Urine, dipstick: NEGATIVE

## 2010-06-16 LAB — HEPATIC FUNCTION PANEL
Albumin: 4.2 g/dL (ref 3.5–5.2)
Bilirubin, Direct: 0.1 mg/dL (ref 0.0–0.3)
Total Protein: 7.2 g/dL (ref 6.0–8.3)

## 2010-06-16 LAB — BASIC METABOLIC PANEL
CO2: 28 mEq/L (ref 19–32)
Chloride: 100 mEq/L (ref 96–112)
Creatinine, Ser: 0.9 mg/dL (ref 0.4–1.2)
Glucose, Bld: 81 mg/dL (ref 70–99)

## 2010-06-17 ENCOUNTER — Other Ambulatory Visit (HOSPITAL_COMMUNITY)
Admission: RE | Admit: 2010-06-17 | Discharge: 2010-06-17 | Disposition: A | Discharge status: Home / Self Care | Payer: Commercial Managed Care - PPO | Source: Ambulatory Visit | Attending: Family Medicine | Admitting: Family Medicine

## 2010-06-17 DIAGNOSIS — Z01419 Encounter for gynecological examination (general) (routine) without abnormal findings: Secondary | ICD-10-CM | POA: Insufficient documentation

## 2010-06-23 NOTE — Assessment & Plan Note (Signed)
Summary: physical, pap, and fasting labs///sph   Vital Signs:  Patient profile:   40 year old female Menstrual status:  regular LMP:     05/24/2010 Height:      65 inches Weight:      199.0 pounds BMI:     33.24 Pulse rate:   107 / minute Pulse rhythm:   regular BP sitting:   112 / 88  (right arm) Cuff size:   large  Vitals Entered By: Almeta Monas CMA Duncan Dull) (June 16, 2010 10:48 AM) CC: cpx/fasting with pap LMP (date): 05/24/2010 LMP - Character: heavy LMP - Reliable? Yes Menarche (age onset years): 11   Menses interval (days): 30 Menstrual flow (days): 7 Menstrual Status regular Enter LMP: 05/24/2010 Last PAP Result NEGATIVE FOR INTRAEPITHELIAL LESIONS OR MALIGNANCY.   History of Present Illness: Pt here for cpe and pap.   No complaints.    Preventive Screening-Counseling & Management  Alcohol-Tobacco     Alcohol drinks/day: 0     Smoking Status: quit     Year Quit: 1999     Pack years: 25.5     Passive Smoke Exposure: no  Caffeine-Diet-Exercise     Caffeine use/day: 5+     Caffeine Counseling: decrease use of caffeine     Does Patient Exercise: no     Times/week: <3  Hep-HIV-STD-Contraception     HIV Risk: no     Sun Exposure-Excessive: frequently  Safety-Violence-Falls     Seat Belt Use: 100      Drug Use:  no.    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 1 By Mouth Once Daily 2)  Adult Aspirin Ec Low Strength 81 Mg Tbec (Aspirin) 3)  Nortriptyline Hcl 50 Mg Caps (Nortriptyline Hcl) .Marland Kitchen.. 1 By Mouth Two Times A Day 4)  Clonazepam 0.5 Mg Tbdp (Clonazepam) .Marland Kitchen.. 1 By Mouth Once Daily 5)  Mvi 6)  Nasonex 50 Mcg/act Susp (Mometasone Furoate) .... 2 Sprays Each Nostril Once Daily 7)  Zyrtec Allergy 10 Mg Tabs (Cetirizine Hcl) .Marland Kitchen.. 1 By Mouth Once Daily 8)  Vicodin Es 7.5-750 Mg Tabs (Hydrocodone-Acetaminophen) .Marland Kitchen.. 1 By Mouth Q6h As Needed 9)  Promethazine Hcl 25 Mg Tabs (Promethazine Hcl) .Marland Kitchen.. 1 By Mouth Three Times A Day As Needed Nausea  Current Medications (verified): 1)  Nexium 40 Mg  Cpdr (Esomeprazole Magnesium) .Marland Kitchen.. 1 By Mouth Once Daily 2)  Adult Aspirin Ec Low Strength 81 Mg Tbec (Aspirin) 3)  Nortriptyline Hcl 50 Mg Caps (Nortriptyline Hcl) .Marland Kitchen.. 1 By Mouth Two Times A Day 4)  Clonazepam 0.5 Mg Tbdp (Clonazepam) .Marland Kitchen.. 1 By Mouth Once Daily 5)  Mvi 6)  Zyrtec Allergy 10 Mg Tabs (Cetirizine Hcl) .Marland Kitchen.. 1 By Mouth Once Daily 7)  Glucosamine Sulfate-Msm 500-500 Mg Tabs (Glucosamine Sulfate-Msm) .Marland Kitchen.. 1 By Mouth  Once Daily 8)  Ra Iron 27 Mg Tabs (Ferrous Sulfate) .... By Mouth Once Daily 9)  Caltrate 600 1500 Mg Tabs (Calcium Carbonate) .... By Mouth Once Daily 10)  Lorazepam 0.5 Mg Tabs (Lorazepam) .... By Mouth As Needed  Allergies (verified): 1)  ! 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 Family History Diabetes 1st degree relative Family History High cholesterol Family History Hypertension MGF- VT, MI MGGM- colon Ca Mother- seeing rheum for possible Lupus  Social History: Reviewed history from 12/06/2006 and no changes required. 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 Eyes:  Denies blurring, discharge, double vision, eye irritation, eye pain, halos, itching, light sensitivity, red eye, vision loss-1 eye, and vision loss-both eyes; optho q1y. ENT:  Denies decreased hearing, difficulty swallowing, ear discharge, earache, hoarseness, nasal congestion, nosebleeds, postnasal drainage, ringing in ears, sinus pressure, and sore throat. CV:  Denies bluish discoloration of lips or nails, chest pain or discomfort, difficulty breathing at night, difficulty breathing while lying down, fainting, fatigue, leg cramps with exertion, lightheadness, near fainting, palpitations, shortness of breath with exertion, swelling of feet, swelling of hands, and weight gain. Resp:  Denies chest discomfort, chest pain with inspiration, cough, coughing up blood, excessive snoring, hypersomnolence, morning headaches, pleuritic, shortness of breath, sputum productive, and wheezing. GI:  Denies abdominal pain, bloody stools,  change in bowel habits, constipation, dark tarry stools, diarrhea, excessive appetite, gas, hemorrhoids, indigestion, loss of appetite, nausea, vomiting, vomiting blood, and yellowish skin color. GU:  Denies abnormal vaginal bleeding, decreased libido, discharge, dysuria, genital sores, hematuria, incontinence, nocturia, urinary frequency, and urinary hesitancy. MS:  Denies joint pain, joint redness, joint swelling, loss of strength, low back pain, mid back pain, muscle aches, muscle , cramps, muscle weakness, stiffness, and thoracic pain. Derm:  Denies changes in color of skin, changes in nail beds, dryness, excessive perspiration, flushing, hair loss, insect bite(s), itching, lesion(s), poor wound healing, and rash. Neuro:  Denies brief paralysis, difficulty with concentration, disturbances in coordination, falling down, headaches, inability to speak, memory loss, numbness, poor balance, seizures, sensation of room spinning, tingling, tremors, visual disturbances, and weakness. Psych:  Denies alternate hallucination ( auditory/visual), anxiety, depression, easily angered, easily tearful, irritability, mental problems, panic attacks, sense of great danger, suicidal thoughts/plans, thoughts of violence, unusual visions or sounds, and thoughts /plans of harming others. Endo:  Denies cold intolerance, excessive hunger, excessive thirst, excessive urination, heat intolerance, polyuria, and weight change. Heme:  Denies abnormal bruising, bleeding, enlarge lymph nodes, fevers, pallor, and skin discoloration. Allergy:  Denies hives or rash, itching eyes, persistent infections, seasonal  allergies, and sneezing.  Physical Exam  General:  Well-developed,well-nourished,in no acute distress; alert,appropriate and cooperative throughout examination Head:  Normocephalic and atraumatic without obvious abnormalities. No apparent alopecia or balding. Eyes:  pupils equal, pupils round, pupils reactive to light, and no  injection.   Ears:  External ear exam shows no significant lesions or deformities.  Otoscopic examination reveals clear canals, tympanic membranes are intact bilaterally without bulging, retraction, inflammation or discharge. Hearing is grossly normal bilaterally. Nose:  External nasal examination shows no deformity or inflammation. Nasal mucosa are pink and moist without lesions or exudates. Mouth:  Oral mucosa and oropharynx without lesions or exudates.  Teeth in good repair. Neck:  No deformities, masses, or tenderness noted. Chest Lawler:  No deformities, masses, or tenderness noted. Breasts:  No mass, nodules, thickening, tenderness, bulging, retraction, inflamation, nipple discharge or skin changes noted.   Lungs:  Normal respiratory effort, chest expands symmetrically. Lungs are clear to auscultation, no crackles or wheezes. Heart:  normal rate and no murmur.   Abdomen:  Bowel sounds positive,abdomen soft and non-tender without masses, organomegaly or hernias noted. Rectal:  No external abnormalities noted. Normal sphincter tone. No rectal masses or tenderness.stool positive for occult blood.   Genitalia:  Pelvic Exam:        External: normal female genitalia without lesions or masses        Vagina: normal without lesions or masses        Cervix: normal without lesions or masses        Adnexa: normal bimanual exam without masses or fullness        Uterus: normal by palpation        Pap smear: performed Msk:  normal ROM, no joint tenderness, no joint swelling, no joint warmth, no redness over joints, no joint deformities, no joint instability, and no crepitation.   Pulses:  R and L carotid,radial,femoral,dorsalis pedis and posterior tibial pulses are full and equal bilaterally Extremities:  No clubbing, cyanosis, edema, or deformity noted with normal full range of motion of all joints.   Neurologic:  No cranial nerve deficits noted. Station and gait are normal. Plantar reflexes are  down-going bilaterally. DTRs are symmetrical throughout. Sensory, motor and coordinative functions appear intact. Skin:  Intact without suspicious lesions or rashes Cervical Nodes:  No lymphadenopathy noted Axillary Nodes:  No palpable lymphadenopathy Psych:  Cognition and judgment appear intact. Alert and cooperative with normal attention span and concentration. No apparent delusions, illusions, hallucinations   Impression & Recommendations:  Problem # 1:  PREVENTIVE HEALTH CARE (ICD-V70.0) ghm utd  Orders: Venipuncture (16109) TLB-Lipid Panel (80061-LIPID) TLB-BMP (Basic Metabolic Panel-BMET) (80048-METABOL) TLB-CBC Platelet - w/Differential (85025-CBCD) TLB-Hepatic/Liver Function Pnl (80076-HEPATIC) TLB-TSH (Thyroid Stimulating Hormone) (84443-TSH) TLB-IBC Pnl (Iron/FE;Transferrin) (83550-IBC) TLB-B12 + Folate Pnl (60454_09811-B14/NWG) Radiology Referral (Radiology) Specimen Handling (95621) EKG w/ Interpretation (93000) UA Dipstick W/ Micro (manual) (81000)  Problem # 2:  ANEMIA (ICD-285.9)  Her updated medication list for this problem includes:    Ra Iron 27 Mg Tabs (Ferrous sulfate) ..... By mouth once daily  Orders: Venipuncture (30865) TLB-Lipid Panel (80061-LIPID) TLB-BMP (Basic Metabolic Panel-BMET) (80048-METABOL) TLB-CBC Platelet - w/Differential (85025-CBCD) TLB-Hepatic/Liver Function Pnl (80076-HEPATIC) TLB-TSH (Thyroid Stimulating Hormone) (84443-TSH) TLB-IBC Pnl (Iron/FE;Transferrin) (83550-IBC) TLB-B12 + Folate Pnl (78469_62952-W41/LKG) Specimen Handling (40102) EKG w/ Interpretation (93000)  Hgb: 13.0 (09/30/2009)   Hct: 38.2 (09/30/2009)   Platelets: 242.0 (09/30/2009) RBC: 4.37 (09/30/2009)   RDW: 14.1 (09/30/2009)   WBC: 4.2 (09/30/2009) MCV: 87.3 (09/30/2009)   MCHC: 34.0 (09/30/2009)  Ferritin: 7.7 (12/03/2008) Iron: 57 (12/03/2008)   % Sat: 16.2 (12/03/2008) TSH: 1.67 (06/18/2008)  Problem # 3:  GERD (ICD-530.1)  Orders: TLB-BMP (Basic  Metabolic Panel-BMET) (80048-METABOL) TLB-CBC Platelet - w/Differential (85025-CBCD)  Problem # 4:  ANXIETY (ICD-300.00)  Her updated medication list for this problem includes:    Nortriptyline Hcl 50 Mg Caps (Nortriptyline hcl) .Marland Kitchen... 1 by mouth two times a day    Clonazepam 0.5 Mg Tbdp (Clonazepam) .Marland Kitchen... 1 by mouth once daily    Lorazepam 0.5 Mg Tabs (Lorazepam) ..... By mouth as needed  Orders: Venipuncture (16109) TLB-Lipid Panel (80061-LIPID) TLB-BMP (Basic Metabolic Panel-BMET) (80048-METABOL) TLB-CBC Platelet - w/Differential (85025-CBCD) Specimen Handling (60454) EKG w/ Interpretation (93000)  Discussed medication use and relaxation techniques.   Her updated medication list for this problem includes:    Nortriptyline Hcl 50 Mg Caps (Nortriptyline hcl) .Marland Kitchen... 1 by mouth two times a day    Clonazepam 0.5 Mg Tbdp (Clonazepam) .Marland Kitchen... 1 by mouth once daily    Lorazepam 0.5 Mg Tabs (Lorazepam) ..... By mouth as needed  Problem # 5:  GUAIAC POSITIVE STOOL (ICD-578.1)  check ifob pt c/o constipation and straining lately increase water and fiber in diet   Orders: EKG w/ Interpretation (93000)  Complete Medication List: 1)  Nexium 40 Mg Cpdr (Esomeprazole magnesium) .Marland Kitchen.. 1 by mouth once daily 2)  Adult Aspirin Ec Low Strength 81 Mg Tbec (Aspirin) 3)  Nortriptyline Hcl 50 Mg Caps (Nortriptyline hcl) .Marland Kitchen.. 1 by mouth two times a day 4)  Clonazepam 0.5 Mg Tbdp (Clonazepam) .Marland Kitchen.. 1 by mouth once daily 5)  Mvi  6)  Zyrtec Allergy 10 Mg Tabs (Cetirizine hcl) .Marland Kitchen.. 1 by mouth once daily 7)  Glucosamine Sulfate-msm 500-500 Mg Tabs (Glucosamine sulfate-msm) .Marland Kitchen.. 1 by mouth once daily 8)  Ra Iron 27 Mg Tabs (Ferrous sulfate) .... By mouth once daily 9)  Caltrate 600 1500 Mg Tabs (Calcium carbonate) .... By mouth once daily 10)  Lorazepam 0.5 Mg Tabs (Lorazepam) .... By mouth as needed   Orders Added: 1)  Venipuncture [36415] 2)  TLB-Lipid Panel [80061-LIPID] 3)  TLB-BMP (Basic  Metabolic Panel-BMET) [80048-METABOL] 4)  TLB-CBC Platelet - w/Differential [85025-CBCD] 5)  TLB-Hepatic/Liver Function Pnl [80076-HEPATIC] 6)  TLB-TSH (Thyroid Stimulating Hormone) [84443-TSH] 7)  TLB-IBC Pnl (Iron/FE;Transferrin) [83550-IBC] 8)  TLB-B12 + Folate Pnl [82746_82607-B12/FOL] 9)  Radiology Referral [Radiology] 10)  Specimen Handling [99000] 11)  Est. Patient 40-64 years [99396] 12)  EKG w/ Interpretation [93000] 13)  UA Dipstick W/ Micro (manual) [81000]    Last Flu Vaccine:  Fluvax 3+ (02/05/2008 9:26:53 AM) Flu Vaccine Result Date:  01/26/2010 Flu Vaccine Result:  given Flu Vaccine Next Due:  1 yr  Laboratory Results   Urine Tests   Date/Time Reported: June 16, 2010 11:51 AM   Routine Urinalysis   Color: yellow Appearance: Clear Glucose: negative   (Normal Range: Negative) Bilirubin: negative   (Normal Range: Negative) Ketone: negative   (Normal Range: Negative) Spec. Gravity: <1.005   (Normal Range: 1.003-1.035) Blood: large   (Normal Range: Negative) pH: 7.0   (Normal Range: 5.0-8.0) Protein: negative   (Normal Range: Negative) Urobilinogen: negative   (Normal Range: 0-1) Nitrite: negative   (Normal Range: Negative) Leukocyte Esterace: negative   (Normal Range: Negative)    Comments: Floydene Flock  June 16, 2010 11:52 AM pt. states just started period.Marland Kitchen...wondered if this is why there was blood in her stool?? no cx sent

## 2010-07-11 ENCOUNTER — Telehealth: Payer: Self-pay | Admitting: Family Medicine

## 2010-07-11 NOTE — Telephone Encounter (Signed)
Called  To the Lab at [facility] and was advised that patient had and -Ifob and it was neg, but it was ordered by Dr.Burchette this is why we did not receive any results     KP   Left message for patient to call back       KP

## 2010-07-11 NOTE — Telephone Encounter (Signed)
Called [name] and Alexis Zamora advised the Ifob was done on [name] DOB: 09/1977, she is a patient of Dr.Burchette. I advised that may have been the incorrect patient, she stated they will look into it and fix it , will enter correct results when completed       KP

## 2010-07-11 NOTE — Telephone Encounter (Signed)
Patient returned tube for hemmacult  Beginning of month she wanted to know if result is back

## 2010-07-12 ENCOUNTER — Encounter: Payer: Self-pay | Admitting: *Deleted

## 2010-07-12 ENCOUNTER — Other Ambulatory Visit (INDEPENDENT_AMBULATORY_CARE_PROVIDER_SITE_OTHER): Payer: Commercial Managed Care - PPO | Admitting: Family Medicine

## 2010-07-12 ENCOUNTER — Other Ambulatory Visit: Payer: Commercial Managed Care - PPO

## 2010-07-12 DIAGNOSIS — Z1211 Encounter for screening for malignant neoplasm of colon: Secondary | ICD-10-CM

## 2010-07-12 NOTE — Telephone Encounter (Signed)
Called patient and left voice to return call... Results of I-Fob as Negative    KP

## 2010-07-12 NOTE — Telephone Encounter (Signed)
Pt aware of I-fob results      KP

## 2010-07-13 NOTE — Progress Notes (Signed)
Pt aware of results     KP

## 2010-07-18 ENCOUNTER — Encounter: Payer: Self-pay | Admitting: Family Medicine

## 2010-07-21 ENCOUNTER — Encounter: Payer: Self-pay | Admitting: Family Medicine

## 2010-09-02 NOTE — Procedures (Signed)
[facility]                            ABDOMINAL ULTRASOUND REPORT   NAME:Alexis Zamora, Alexis Zamora                         MRN:          [id number]  DATE:12/22/2005                            DOB:          08-Aug-1970    ACCESSION # [id number]   ORDERING PHYSICIAN:  Lelon Perla, DO   PROCEDURE:  Multiplanar abdominal ultrasound imaging was performed in the  upright, supine, right and left lateral decubitus positions.   RESULTS:  Abdominal aorta measures 1.3 cm.   The abdominal aorta appears normal.  The IVC is patent.   The pancreas appears normal throughout the head, body, and tail without  evidence of ductal dilatation, pancreatic masses, or peripancreatic  inflammation.   The gallbladder is well distended, the Learn thickness measuring 2.6 mm.  The  common bile duct measures 4.5 mm in diameter without evidence of  intraluminal foci.   The liver appears normal without evidence of parenchymal lesion, ductal  dilatation, or vascular abnormality.   The right kidney measures 9.1 cm and the left kidney measures 10.6 cm.  Both  kidneys are normal.   The spleen measures 10.9 cm and is normal without parenchymal lesion.   IMPRESSION:  No abnormalities noted.                                   Venita Lick. Russella Dar, MD, [facility]   MTS/MedQ  DD:  12/22/2005  DT:  12/22/2005  Job #:  [id number]   cc:   Lelon Perla, DO

## 2010-12-28 ENCOUNTER — Telehealth: Payer: Self-pay | Admitting: Family Medicine

## 2010-12-28 NOTE — Telephone Encounter (Signed)
Patient has found a lump on her ribs on the side of her rib cage---had Mammo on 07/14/2010, had to return for second mammo, then had an ultrasound---was told it was a milk duct---doesn't feel like this lump is related to breast problem, but wants to ask questions (she wasn't really clear how close it was to breast area and if it was under arm )

## 2010-12-28 NOTE — Telephone Encounter (Signed)
Pt coming in for OV

## 2010-12-30 ENCOUNTER — Encounter: Payer: Self-pay | Admitting: Family Medicine

## 2010-12-30 ENCOUNTER — Telehealth: Payer: Self-pay

## 2010-12-30 ENCOUNTER — Ambulatory Visit (INDEPENDENT_AMBULATORY_CARE_PROVIDER_SITE_OTHER): Payer: Commercial Managed Care - PPO | Admitting: Family Medicine

## 2010-12-30 ENCOUNTER — Ambulatory Visit (HOSPITAL_BASED_OUTPATIENT_CLINIC_OR_DEPARTMENT_OTHER)
Admission: RE | Admit: 2010-12-30 | Discharge: 2010-12-30 | Disposition: A | Discharge status: Home / Self Care | Payer: Commercial Managed Care - PPO | Source: Ambulatory Visit | Attending: Family Medicine | Admitting: Family Medicine

## 2010-12-30 VITALS — BP 130/92 | HR 106 | Temp 98.5°F | Wt 202.4 lb

## 2010-12-30 DIAGNOSIS — R1901 Right upper quadrant abdominal swelling, mass and lump: Secondary | ICD-10-CM | POA: Insufficient documentation

## 2010-12-30 DIAGNOSIS — R1900 Intra-abdominal and pelvic swelling, mass and lump, unspecified site: Secondary | ICD-10-CM

## 2010-12-30 NOTE — Progress Notes (Signed)
  Subjective:    Patient ID: Alexis Zamora, female    DOB: [birth year], 40 y.o.   MRN: [id number]  HPI Pt here c/o soft mass on R side of abd.  Non tender,  She is not sure how long it has been there.   Review of Systems    as above Objective:   Physical Exam  Constitutional: She appears well-developed and well-nourished.  Cardiovascular: Normal rate and regular rhythm.   Abdominal: Soft. She exhibits no distension. There is no tenderness. There is no rebound and no guarding.       Soft tissue mass Ruq under ribs  Psychiatric: She has a normal mood and affect. Her behavior is normal. Judgment and thought content normal.          Assessment & Plan:  Soft tissue mass---- prob lipoma                       Check [place] abb

## 2010-12-30 NOTE — Telephone Encounter (Signed)
Message copied by Arnette Norris on Fri Dec 30, 2010  5:26 PM ------      Message from: Lelon Perla      Created: Fri Dec 30, 2010  2:38 PM       Probable soft tissue mass----will need to ct abd with contrast to see it defined

## 2011-01-02 ENCOUNTER — Telehealth: Payer: Self-pay

## 2011-01-02 ENCOUNTER — Ambulatory Visit (HOSPITAL_BASED_OUTPATIENT_CLINIC_OR_DEPARTMENT_OTHER)
Admission: RE | Admit: 2011-01-02 | Discharge: 2011-01-02 | Disposition: A | Discharge status: Home / Self Care | Payer: Commercial Managed Care - PPO | Source: Ambulatory Visit | Attending: Family Medicine | Admitting: Family Medicine

## 2011-01-02 DIAGNOSIS — R1909 Other intra-abdominal and pelvic swelling, mass and lump: Secondary | ICD-10-CM | POA: Insufficient documentation

## 2011-01-02 DIAGNOSIS — R1900 Intra-abdominal and pelvic swelling, mass and lump, unspecified site: Secondary | ICD-10-CM

## 2011-01-02 MED ORDER — IOHEXOL 300 MG/ML  SOLN
100.0000 mL | Freq: Once | INTRAMUSCULAR | Status: AC | PRN
  Administered 2011-01-02: 100 mL via INTRAVENOUS

## 2011-01-02 NOTE — Telephone Encounter (Signed)
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 She wanted your opinion on whether she should have it removed or not. Please advise    KP

## 2011-01-02 NOTE — Telephone Encounter (Signed)
Discussed with patient and she voiced understanding. Will call if she decides she wants to have it removed     KP

## 2011-01-02 NOTE — Telephone Encounter (Signed)
Message copied by Arnette Norris on Mon Jan 02, 2011  1:22 PM ------      Message from: Lelon Perla      Created: Mon Jan 02, 2011  1:03 PM       Completely normal--- nothing to worry about

## 2011-01-02 NOTE — Telephone Encounter (Signed)
No reason to do anything with it unless it is painful

## 2011-09-19 ENCOUNTER — Encounter: Payer: Self-pay | Admitting: Family Medicine

## 2011-09-21 ENCOUNTER — Ambulatory Visit (INDEPENDENT_AMBULATORY_CARE_PROVIDER_SITE_OTHER): Payer: Commercial Managed Care - PPO | Admitting: Family Medicine

## 2011-09-21 ENCOUNTER — Encounter: Payer: Self-pay | Admitting: Family Medicine

## 2011-09-21 ENCOUNTER — Other Ambulatory Visit (HOSPITAL_COMMUNITY)
Admission: RE | Admit: 2011-09-21 | Discharge: 2011-09-21 | Disposition: A | Discharge status: Home / Self Care | Payer: Commercial Managed Care - PPO | Source: Ambulatory Visit | Attending: Family Medicine | Admitting: Family Medicine

## 2011-09-21 VITALS — BP 134/88 | HR 100 | Temp 98.0°F | Ht 65.0 in | Wt 204.4 lb

## 2011-09-21 DIAGNOSIS — K219 Gastro-esophageal reflux disease without esophagitis: Secondary | ICD-10-CM

## 2011-09-21 DIAGNOSIS — Z0000 Encounter for general adult medical examination without abnormal findings: Secondary | ICD-10-CM

## 2011-09-21 DIAGNOSIS — Z01419 Encounter for gynecological examination (general) (routine) without abnormal findings: Secondary | ICD-10-CM | POA: Insufficient documentation

## 2011-09-21 DIAGNOSIS — Z124 Encounter for screening for malignant neoplasm of cervix: Secondary | ICD-10-CM

## 2011-09-21 DIAGNOSIS — R319 Hematuria, unspecified: Secondary | ICD-10-CM

## 2011-09-21 DIAGNOSIS — Z79899 Other long term (current) drug therapy: Secondary | ICD-10-CM

## 2011-09-21 LAB — LIPID PANEL
Cholesterol: 173 mg/dL (ref 0–200)
VLDL: 17.8 mg/dL (ref 0.0–40.0)

## 2011-09-21 LAB — CBC WITH DIFFERENTIAL/PLATELET
Basophils Absolute: 0.1 10*3/uL (ref 0.0–0.1)
Basophils Relative: 1.2 % (ref 0.0–3.0)
Eosinophils Relative: 1.7 % (ref 0.0–5.0)
HCT: 40.9 % (ref 36.0–46.0)
Hemoglobin: 13.6 g/dL (ref 12.0–15.0)
Lymphocytes Relative: 27.9 % (ref 12.0–46.0)
Lymphs Abs: 1.8 10*3/uL (ref 0.7–4.0)
Monocytes Relative: 9.5 % (ref 3.0–12.0)
Neutro Abs: 3.9 10*3/uL (ref 1.4–7.7)
RBC: 4.7 Mil/uL (ref 3.87–5.11)
RDW: 14 % (ref 11.5–14.6)
WBC: 6.5 10*3/uL (ref 4.5–10.5)

## 2011-09-21 LAB — BASIC METABOLIC PANEL
Calcium: 8.9 mg/dL (ref 8.4–10.5)
GFR: 81.08 mL/min (ref >60.00)
Glucose, Bld: 79 mg/dL (ref 70–99)
Potassium: 4.7 mEq/L (ref 3.5–5.1)
Sodium: 143 mEq/L (ref 135–145)

## 2011-09-21 LAB — POCT URINALYSIS DIPSTICK
Glucose, UA: NEGATIVE
Ketones, UA: NEGATIVE
Leukocytes, UA: NEGATIVE
Protein, UA: NEGATIVE
Spec Grav, UA: <=1.005

## 2011-09-21 LAB — HEPATIC FUNCTION PANEL
ALT: 19 U/L (ref 0–35)
AST: 23 U/L (ref 0–37)
Albumin: 3.9 g/dL (ref 3.5–5.2)
Alkaline Phosphatase: 54 U/L (ref 39–117)
Total Protein: 7.1 g/dL (ref 6.0–8.3)

## 2011-09-21 LAB — MAGNESIUM: Magnesium: 2.1 mg/dL (ref 1.5–2.5)

## 2011-09-21 NOTE — Assessment & Plan Note (Signed)
Per GI

## 2011-09-21 NOTE — Progress Notes (Signed)
Addended by: Candie Echevaria L on: 09/21/2011 02:54 PM   Modules accepted: Orders

## 2011-09-21 NOTE — Progress Notes (Signed)
Subjective:     Alexis Zamora is a 41 y.o. female and is here for a comprehensive physical exam. The patient reports no problems.  History   Social History  . Marital Status: Divorced    Spouse Name: N/A    Number of Children: N/A  . Years of Education: N/A   Occupational History  . ER--unit secretary [employer]   Social History Main Topics  . Smoking status: Former Smoker -- 1.5 packs/day for 18 years    Types: Cigarettes    Quit date: 09/21/2003  . Smokeless tobacco: Never Used  . Alcohol Use: No  . Drug Use: No  . Sexually Active: Not Currently   Other Topics Concern  . Not on file   Social History Narrative   Exercise-- no   Health Maintenance  Topic Date Due  . Tetanus/tdap  06/29/2011  . Influenza Vaccine  01/16/2012  . Mammogram  08/24/2012  . Pap Smear  09/21/2014    The following portions of the patient's history were reviewed and updated as appropriate: allergies, current medications, past family history, past medical history, past social history, past surgical history and problem list.  Review of Systems Review of Systems  Constitutional: Negative for activity change, appetite change and fatigue.  HENT: Negative for hearing loss, congestion, tinnitus and ear discharge.  dentist q28m Eyes: Negative for visual disturbance (see optho q1y -- vision corrected to 20/20 with glasses).  Respiratory: Negative for cough, chest tightness and shortness of breath.   Cardiovascular: Negative for chest pain, palpitations and leg swelling.  Gastrointestinal: Negative for abdominal pain, diarrhea, constipation and abdominal distention.  Genitourinary: Negative for urgency, frequency, decreased urine volume and difficulty urinating.  Musculoskeletal: Negative for back pain, arthralgias and gait problem.  Skin: Negative for color change, pallor and rash.  Neurological: Negative for dizziness, light-headedness, numbness and headaches.  Hematological: Negative  for adenopathy. Does not bruise/bleed easily.  Psychiatric/Behavioral: Negative for suicidal ideas, confusion, sleep disturbance, self-injury, dysphoric mood, decreased concentration and agitation.        Objective:    BP 134/88  Pulse 100  Temp(Src) 98 F (36.7 C) (Oral)  Ht 5\' 5"  (1.651 m)  Wt 204 lb 6.4 oz (92.715 kg)  BMI 34.01 kg/m2  SpO2 96%  LMP 09/16/2011 General appearance: alert, cooperative, appears stated age and no distress Head: Normocephalic, without obvious abnormality, atraumatic Eyes: conjunctivae/corneas clear. PERRL, EOM's intact. Fundi benign. Ears: normal TM's and external ear canals both ears Nose: Nares normal. Septum midline. Mucosa normal. No drainage or sinus tenderness. Throat: lips, mucosa, and tongue normal; teeth and gums normal Neck: no adenopathy, no carotid bruit, no JVD, supple, symmetrical, trachea midline and thyroid not enlarged, symmetric, no tenderness/mass/nodules Back: symmetric, no curvature. ROM normal. No CVA tenderness. Lungs: clear to auscultation bilaterally Breasts: normal appearance, no masses or tenderness Heart: regular rate and rhythm, S1, S2 normal, no murmur, click, rub or gallop Abdomen: soft, non-tender; bowel sounds normal; no masses,  no organomegaly Pelvic: cervix normal in appearance, external genitalia normal, no adnexal masses or tenderness, no cervical motion tenderness, rectovaginal septum normal, uterus normal size, shape, and consistency and vagina normal without discharge Extremities: extremities normal, atraumatic, no cyanosis or edema Pulses: 2+ and symmetric Skin: Skin color, texture, turgor normal. No rashes or lesions Lymph nodes: Cervical, supraclavicular, and axillary nodes normal. Neurologic: Alert and oriented X 3, normal strength and tone. Normal symmetric reflexes. Normal coordination and gait psych-- no depression or anxiety  Assessment:    Healthy female exam.      Plan:     See After  Visit Summary for Counseling Recommendations

## 2011-09-21 NOTE — Patient Instructions (Signed)
Preventive Care for Adults, Female A healthy lifestyle and preventive care can promote health and wellness. Preventive health guidelines for women include the following key practices.  A routine yearly physical is a good way to check with your caregiver about your health and preventive screening. It is a chance to share any concerns and updates on your health, and to receive a thorough exam.   Visit your dentist for a routine exam and preventive care every 6 months. Brush your teeth twice a day and floss once a day. Good oral hygiene prevents tooth decay and gum disease.   The frequency of eye exams is based on your age, health, family medical history, use of contact lenses, and other factors. Follow your caregiver's recommendations for frequency of eye exams.   Eat a healthy diet. Foods like vegetables, fruits, whole grains, low-fat dairy products, and lean protein foods contain the nutrients you need without too many calories. Decrease your intake of foods high in solid fats, added sugars, and salt. Eat the right amount of calories for you.Get information about a proper diet from your caregiver, if necessary.   Regular physical exercise is one of the most important things you can do for your health. Most adults should get at least 150 minutes of moderate-intensity exercise (any activity that increases your heart rate and causes you to sweat) each week. In addition, most adults need muscle-strengthening exercises on 2 or more days a week.   Maintain a healthy weight. The body mass index (BMI) is a screening tool to identify possible weight problems. It provides an estimate of body fat based on height and weight. Your caregiver can help determine your BMI, and can help you achieve or maintain a healthy weight.For adults 20 years and older:   A BMI below 18.5 is considered underweight.   A BMI of 18.5 to 24.9 is normal.   A BMI of 25 to 29.9 is considered overweight.   A BMI of 30 and above is  considered obese.   Maintain normal blood lipids and cholesterol levels by exercising and minimizing your intake of saturated fat. Eat a balanced diet with plenty of fruit and vegetables. Blood tests for lipids and cholesterol should begin at age 20 and be repeated every 5 years. If your lipid or cholesterol levels are high, you are over 50, or you are at high risk for heart disease, you may need your cholesterol levels checked more frequently.Ongoing high lipid and cholesterol levels should be treated with medicines if diet and exercise are not effective.   If you smoke, find out from your caregiver how to quit. If you do not use tobacco, do not start.   If you are pregnant, do not drink alcohol. If you are breastfeeding, be very cautious about drinking alcohol. If you are not pregnant and choose to drink alcohol, do not exceed 1 drink per day. One drink is considered to be 12 ounces (355 mL) of beer, 5 ounces (148 mL) of wine, or 1.5 ounces (44 mL) of liquor.   Avoid use of street drugs. Do not share needles with anyone. Ask for help if you need support or instructions about stopping the use of drugs.   High blood pressure causes heart disease and increases the risk of stroke. Your blood pressure should be checked at least every 1 to 2 years. Ongoing high blood pressure should be treated with medicines if weight loss and exercise are not effective.   If you are 55 to 43   years old, ask your caregiver if you should take aspirin to prevent strokes.   Diabetes screening involves taking a blood sample to check your fasting blood sugar level. This should be done once every 3 years, after age 45, if you are within normal weight and without risk factors for diabetes. Testing should be considered at a younger age or be carried out more frequently if you are overweight and have at least 1 risk factor for diabetes.   Breast cancer screening is essential preventive care for women. You should practice "breast  self-awareness." This means understanding the normal appearance and feel of your breasts and may include breast self-examination. Any changes detected, no matter how small, should be reported to a caregiver. Women in their 20s and 30s should have a clinical breast exam (CBE) by a caregiver as part of a regular health exam every 1 to 3 years. After age 40, women should have a CBE every year. Starting at age 40, women should consider having a mammography (breast X-ray test) every year. Women who have a family history of breast cancer should talk to their caregiver about genetic screening. Women at a high risk of breast cancer should talk to their caregivers about having magnetic resonance imaging (MRI) and a mammography every year.   The Pap test is a screening test for cervical cancer. A Pap test can show cell changes on the cervix that might become cervical cancer if left untreated. A Pap test is a procedure in which cells are obtained and examined from the lower end of the uterus (cervix).   Women should have a Pap test starting at age 21.   Between ages 21 and 29, Pap tests should be repeated every 2 years.   Beginning at age 30, you should have a Pap test every 3 years as long as the past 3 Pap tests have been normal.   Some women have medical problems that increase the chance of getting cervical cancer. Talk to your caregiver about these problems. It is especially important to talk to your caregiver if a new problem develops soon after your last Pap test. In these cases, your caregiver may recommend more frequent screening and Pap tests.   The above recommendations are the same for women who have or have not gotten the vaccine for human papillomavirus (HPV).   If you had a hysterectomy for a problem that was not cancer or a condition that could lead to cancer, then you no longer need Pap tests. Even if you no longer need a Pap test, a regular exam is a good idea to make sure no other problems are  starting.   If you are between ages 65 and 70, and you have had normal Pap tests going back 10 years, you no longer need Pap tests. Even if you no longer need a Pap test, a regular exam is a good idea to make sure no other problems are starting.   If you have had past treatment for cervical cancer or a condition that could lead to cancer, you need Pap tests and screening for cancer for at least 20 years after your treatment.   If Pap tests have been discontinued, risk factors (such as a new sexual partner) need to be reassessed to determine if screening should be resumed.   The HPV test is an additional test that may be used for cervical cancer screening. The HPV test looks for the virus that can cause the cell changes on the cervix.   The cells collected during the Pap test can be tested for HPV. The HPV test could be used to screen women aged 30 years and older, and should be used in women of any age who have unclear Pap test results. After the age of 30, women should have HPV testing at the same frequency as a Pap test.   Colorectal cancer can be detected and often prevented. Most routine colorectal cancer screening begins at the age of 50 and continues through age 75. However, your caregiver may recommend screening at an earlier age if you have risk factors for colon cancer. On a yearly basis, your caregiver may provide home test kits to check for hidden blood in the stool. Use of a small camera at the end of a tube, to directly examine the colon (sigmoidoscopy or colonoscopy), can detect the earliest forms of colorectal cancer. Talk to your caregiver about this at age 50, when routine screening begins. Direct examination of the colon should be repeated every 5 to 10 years through age 75, unless early forms of pre-cancerous polyps or small growths are found.   Hepatitis C blood testing is recommended for all people born from 1945 through 1965 and any individual with known risks for hepatitis C.    Practice safe sex. Use condoms and avoid high-risk sexual practices to reduce the spread of sexually transmitted infections (STIs). STIs include gonorrhea, chlamydia, syphilis, trichomonas, herpes, HPV, and human immunodeficiency virus (HIV). Herpes, HIV, and HPV are viral illnesses that have no cure. They can result in disability, cancer, and death. Sexually active women aged 25 and younger should be checked for chlamydia. Older women with new or multiple partners should also be tested for chlamydia. Testing for other STIs is recommended if you are sexually active and at increased risk.   Osteoporosis is a disease in which the bones lose minerals and strength with aging. This can result in serious bone fractures. The risk of osteoporosis can be identified using a bone density scan. Women ages 65 and over and women at risk for fractures or osteoporosis should discuss screening with their caregivers. Ask your caregiver whether you should take a calcium supplement or vitamin D to reduce the rate of osteoporosis.   Menopause can be associated with physical symptoms and risks. Hormone replacement therapy is available to decrease symptoms and risks. You should talk to your caregiver about whether hormone replacement therapy is right for you.   Use sunscreen with sun protection factor (SPF) of 30 or more. Apply sunscreen liberally and repeatedly throughout the day. You should seek shade when your shadow is shorter than you. Protect yourself by wearing long sleeves, pants, a wide-brimmed hat, and sunglasses year round, whenever you are outdoors.   Once a month, do a whole body skin exam, using a mirror to look at the skin on your back. Notify your caregiver of new moles, moles that have irregular borders, moles that are larger than a pencil eraser, or moles that have changed in shape or color.   Stay current with required immunizations.   Influenza. You need a dose every fall (or winter). The composition of  the flu vaccine changes each year, so being vaccinated once is not enough.   Pneumococcal polysaccharide. You need 1 to 2 doses if you smoke cigarettes or if you have certain chronic medical conditions. You need 1 dose at age 65 (or older) if you have never been vaccinated.   Tetanus, diphtheria, pertussis (Tdap, Td). Get 1 dose of   Tdap vaccine if you are younger than age 65, are over 65 and have contact with an infant, are a healthcare worker, are pregnant, or simply want to be protected from whooping cough. After that, you need a Td booster dose every 10 years. Consult your caregiver if you have not had at least 3 tetanus and diphtheria-containing shots sometime in your life or have a deep or dirty wound.   HPV. You need this vaccine if you are a woman age 26 or younger. The vaccine is given in 3 doses over 6 months.   Measles, mumps, rubella (MMR). You need at least 1 dose of MMR if you were born in 1957 or later. You may also need a second dose.   Meningococcal. If you are age 19 to 21 and a first-year college student living in a residence hall, or have one of several medical conditions, you need to get vaccinated against meningococcal disease. You may also need additional booster doses.   Zoster (shingles). If you are age 60 or older, you should get this vaccine.   Varicella (chickenpox). If you have never had chickenpox or you were vaccinated but received only 1 dose, talk to your caregiver to find out if you need this vaccine.   Hepatitis A. You need this vaccine if you have a specific risk factor for hepatitis A virus infection or you simply wish to be protected from this disease. The vaccine is usually given as 2 doses, 6 to 18 months apart.   Hepatitis B. You need this vaccine if you have a specific risk factor for hepatitis B virus infection or you simply wish to be protected from this disease. The vaccine is given in 3 doses, usually over 6 months.  Preventive Services /  Frequency Ages 19 to 39  Blood pressure check.** / Every 1 to 2 years.   Lipid and cholesterol check.** / Every 5 years beginning at age 20.   Clinical breast exam.** / Every 3 years for women in their 20s and 30s.   Pap test.** / Every 2 years from ages 21 through 29. Every 3 years starting at age 30 through age 65 or 70 with a history of 3 consecutive normal Pap tests.   HPV screening.** / Every 3 years from ages 30 through ages 65 to 70 with a history of 3 consecutive normal Pap tests.   Hepatitis C blood test.** / For any individual with known risks for hepatitis C.   Skin self-exam. / Monthly.   Influenza immunization.** / Every year.   Pneumococcal polysaccharide immunization.** / 1 to 2 doses if you smoke cigarettes or if you have certain chronic medical conditions.   Tetanus, diphtheria, pertussis (Tdap, Td) immunization. / A one-time dose of Tdap vaccine. After that, you need a Td booster dose every 10 years.   HPV immunization. / 3 doses over 6 months, if you are 26 and younger.   Measles, mumps, rubella (MMR) immunization. / You need at least 1 dose of MMR if you were born in 1957 or later. You may also need a second dose.   Meningococcal immunization. / 1 dose if you are age 19 to 21 and a first-year college student living in a residence hall, or have one of several medical conditions, you need to get vaccinated against meningococcal disease. You may also need additional booster doses.   Varicella immunization.** / Consult your caregiver.   Hepatitis A immunization.** / Consult your caregiver. 2 doses, 6 to 18 months   apart.   Hepatitis B immunization.** / Consult your caregiver. 3 doses usually over 6 months.  Ages 40 to 64  Blood pressure check.** / Every 1 to 2 years.   Lipid and cholesterol check.** / Every 5 years beginning at age 20.   Clinical breast exam.** / Every year after age 40.   Mammogram.** / Every year beginning at age 40 and continuing for as  long as you are in good health. Consult with your caregiver.   Pap test.** / Every 3 years starting at age 30 through age 65 or 70 with a history of 3 consecutive normal Pap tests.   HPV screening.** / Every 3 years from ages 30 through ages 65 to 70 with a history of 3 consecutive normal Pap tests.   Fecal occult blood test (FOBT) of stool. / Every year beginning at age 50 and continuing until age 75. You may not need to do this test if you get a colonoscopy every 10 years.   Flexible sigmoidoscopy or colonoscopy.** / Every 5 years for a flexible sigmoidoscopy or every 10 years for a colonoscopy beginning at age 50 and continuing until age 75.   Hepatitis C blood test.** / For all people born from 1945 through 1965 and any individual with known risks for hepatitis C.   Skin self-exam. / Monthly.   Influenza immunization.** / Every year.   Pneumococcal polysaccharide immunization.** / 1 to 2 doses if you smoke cigarettes or if you have certain chronic medical conditions.   Tetanus, diphtheria, pertussis (Tdap, Td) immunization.** / A one-time dose of Tdap vaccine. After that, you need a Td booster dose every 10 years.   Measles, mumps, rubella (MMR) immunization. / You need at least 1 dose of MMR if you were born in 1957 or later. You may also need a second dose.   Varicella immunization.** / Consult your caregiver.   Meningococcal immunization.** / Consult your caregiver.   Hepatitis A immunization.** / Consult your caregiver. 2 doses, 6 to 18 months apart.   Hepatitis B immunization.** / Consult your caregiver. 3 doses, usually over 6 months.  Ages 65 and over  Blood pressure check.** / Every 1 to 2 years.   Lipid and cholesterol check.** / Every 5 years beginning at age 20.   Clinical breast exam.** / Every year after age 40.   Mammogram.** / Every year beginning at age 40 and continuing for as long as you are in good health. Consult with your caregiver.   Pap test.** /  Every 3 years starting at age 30 through age 65 or 70 with a 3 consecutive normal Pap tests. Testing can be stopped between 65 and 70 with 3 consecutive normal Pap tests and no abnormal Pap or HPV tests in the past 10 years.   HPV screening.** / Every 3 years from ages 30 through ages 65 or 70 with a history of 3 consecutive normal Pap tests. Testing can be stopped between 65 and 70 with 3 consecutive normal Pap tests and no abnormal Pap or HPV tests in the past 10 years.   Fecal occult blood test (FOBT) of stool. / Every year beginning at age 50 and continuing until age 75. You may not need to do this test if you get a colonoscopy every 10 years.   Flexible sigmoidoscopy or colonoscopy.** / Every 5 years for a flexible sigmoidoscopy or every 10 years for a colonoscopy beginning at age 50 and continuing until age 75.   Hepatitis   C blood test.** / For all people born from 1945 through 1965 and any individual with known risks for hepatitis C.   Osteoporosis screening.** / A one-time screening for women ages 65 and over and women at risk for fractures or osteoporosis.   Skin self-exam. / Monthly.   Influenza immunization.** / Every year.   Pneumococcal polysaccharide immunization.** / 1 dose at age 65 (or older) if you have never been vaccinated.   Tetanus, diphtheria, pertussis (Tdap, Td) immunization. / A one-time dose of Tdap vaccine if you are over 65 and have contact with an infant, are a healthcare worker, or simply want to be protected from whooping cough. After that, you need a Td booster dose every 10 years.   Varicella immunization.** / Consult your caregiver.   Meningococcal immunization.** / Consult your caregiver.   Hepatitis A immunization.** / Consult your caregiver. 2 doses, 6 to 18 months apart.   Hepatitis B immunization.** / Check with your caregiver. 3 doses, usually over 6 months.  ** Family history and personal history of risk and conditions may change your caregiver's  recommendations. Document Released: 05/30/2001 Document Revised: 03/23/2011 Document Reviewed: 08/29/2010 [employer] Patient Information 2012 [employer]. 

## 2011-09-22 LAB — TSH: TSH: 2.02 u[IU]/mL (ref 0.35–5.50)

## 2011-09-23 LAB — URINE CULTURE: Organism ID, Bacteria: NO GROWTH

## 2011-10-24 ENCOUNTER — Other Ambulatory Visit: Payer: Commercial Managed Care - PPO

## 2011-10-25 NOTE — Addendum Note (Signed)
Addended by: Arnette Norris on: 10/25/2011 11:21 AM   Modules accepted: Orders

## 2011-10-26 LAB — FECAL OCCULT BLOOD, IMMUNOCHEMICAL: Fecal Occult Bld: NEGATIVE

## 2012-06-01 ENCOUNTER — Other Ambulatory Visit: Payer: Self-pay

## 2012-11-15 ENCOUNTER — Encounter: Payer: Self-pay | Admitting: Family Medicine

## 2012-11-18 IMAGING — US US ABDOMEN COMPLETE
1 series · 14 of 25 positions shown · non-contrast
Comparison: 12/22/2005.

CLINICAL DATA: Soft tissue mass in right upper quadrant.
Palpable.  Family history gallbladder disease.

COMPLETE ABDOMINAL ULTRASOUND

[Series 1: us abdomen complete · 0.39mm/px · 14 of 81 slices shown]
[im 1/81]
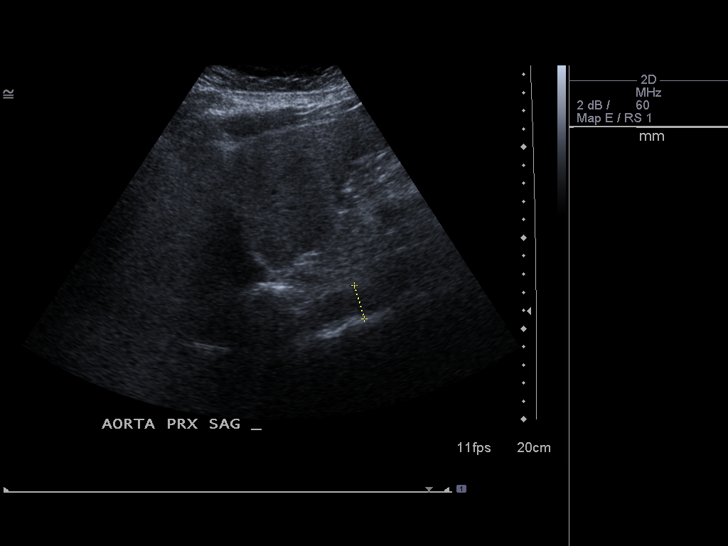
[im 7/81]
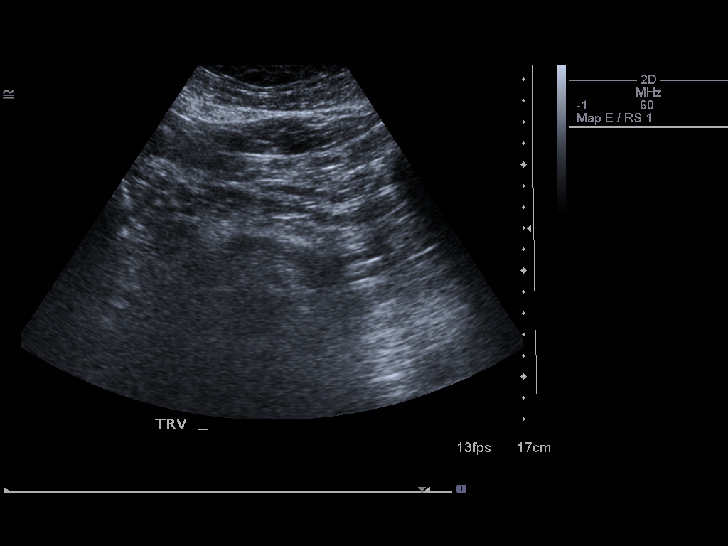
[im 14/81]
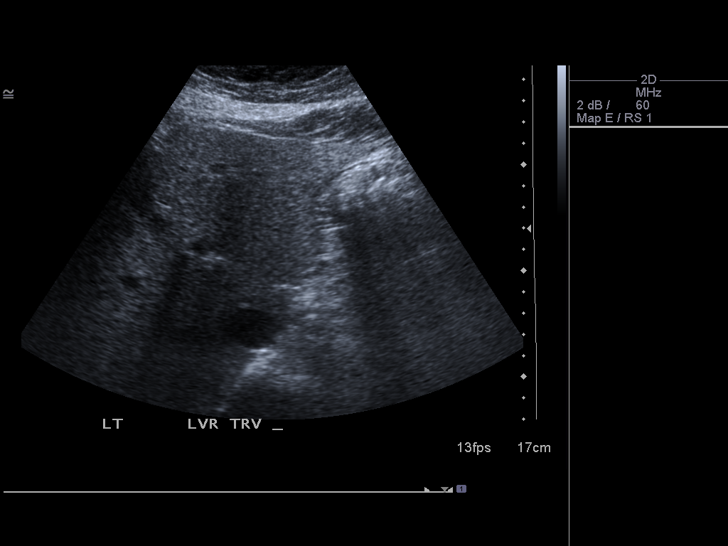
[im 21/81]
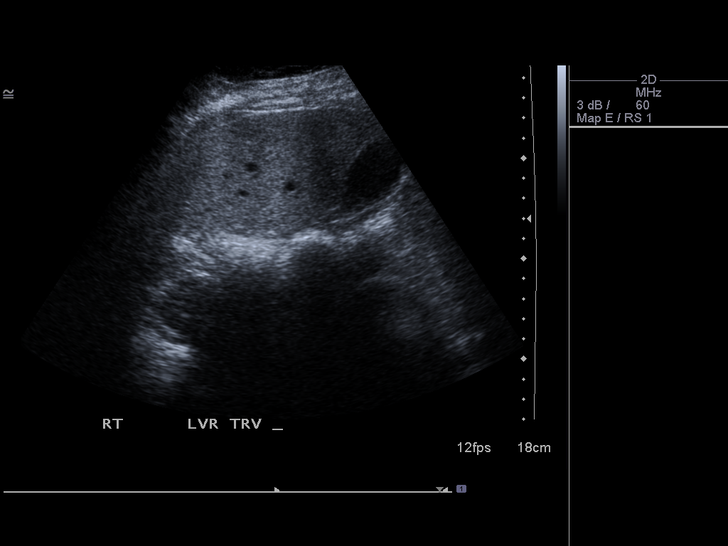
[im 27/81]
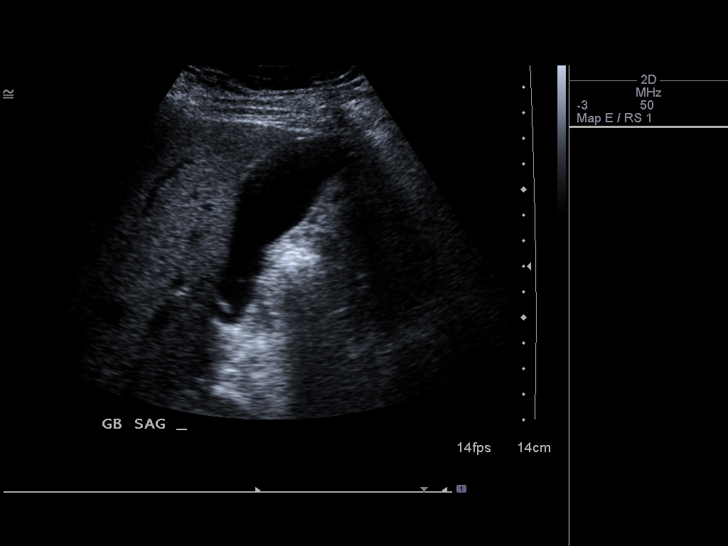
[im 31/81]
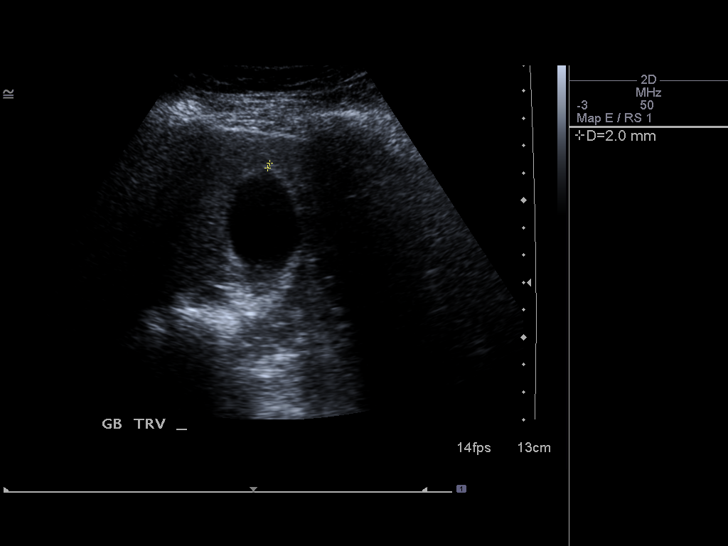
[im 37/81]
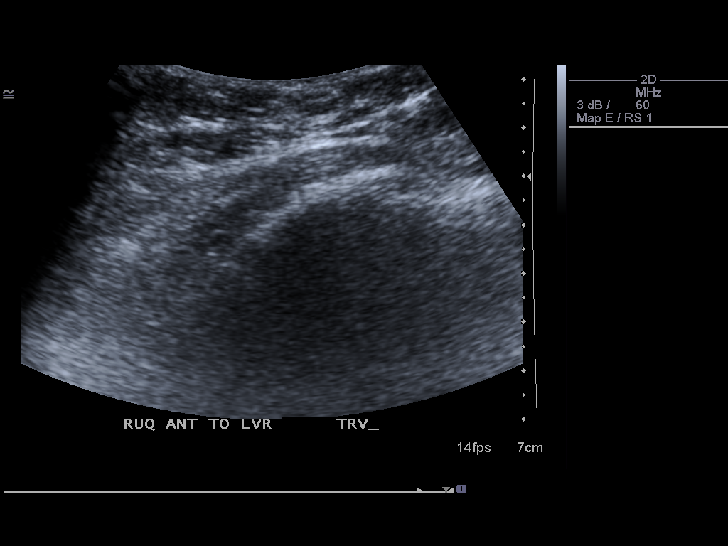
[im 44/81]
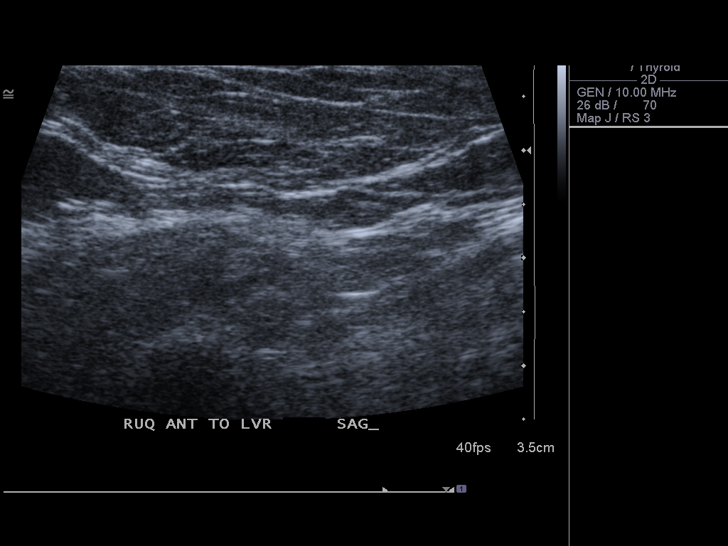
[im 51/81]
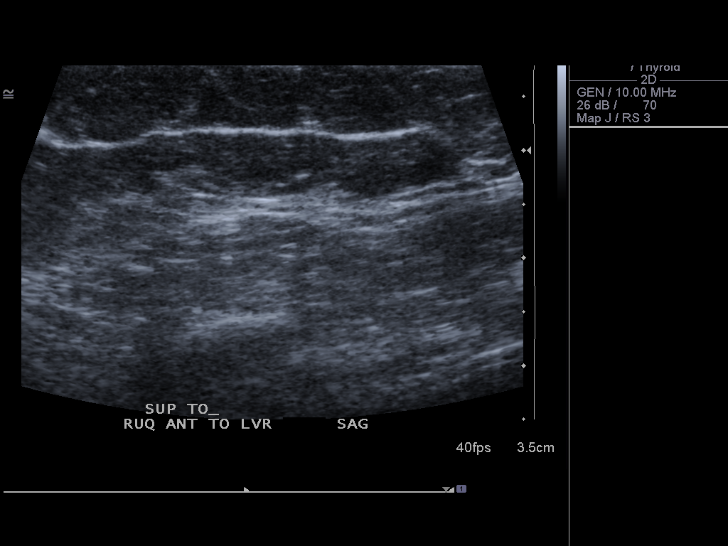
[im 54/81]
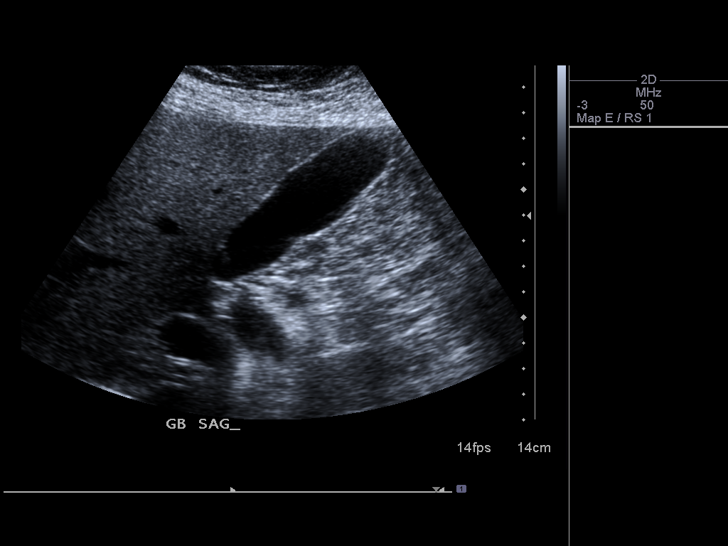
[im 61/81]
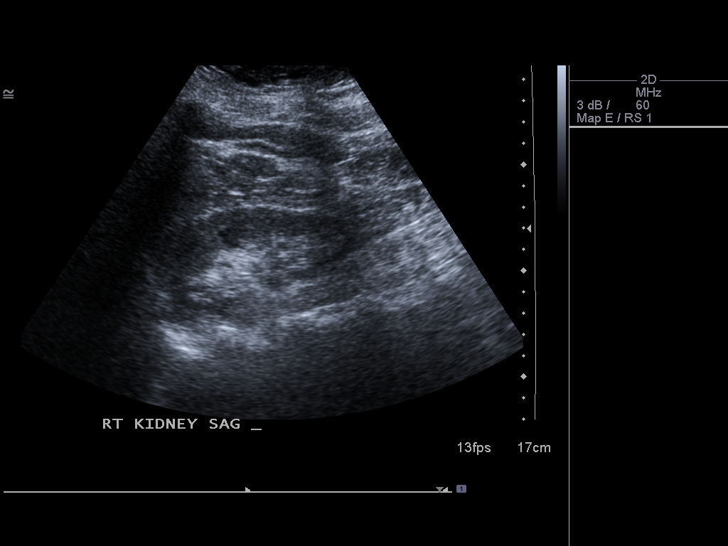
[im 67/81]
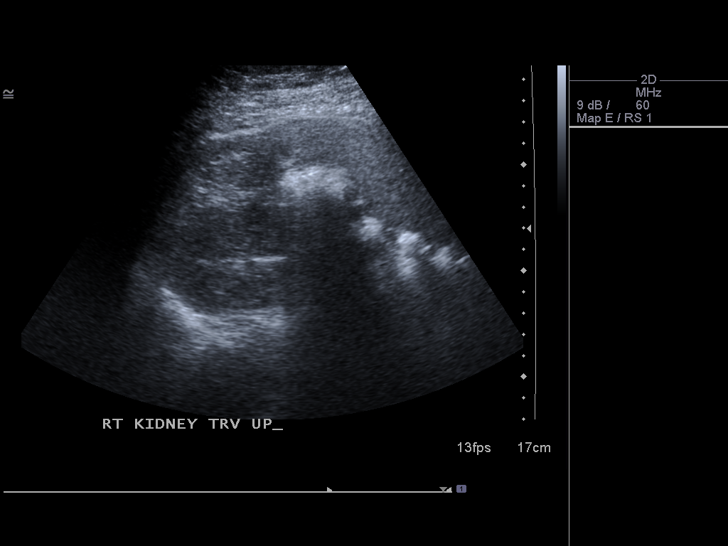
[im 74/81]
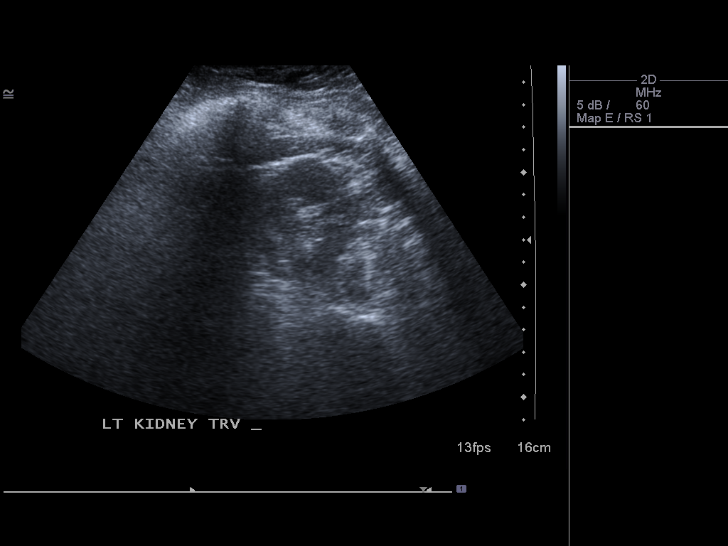
[im 81/81]
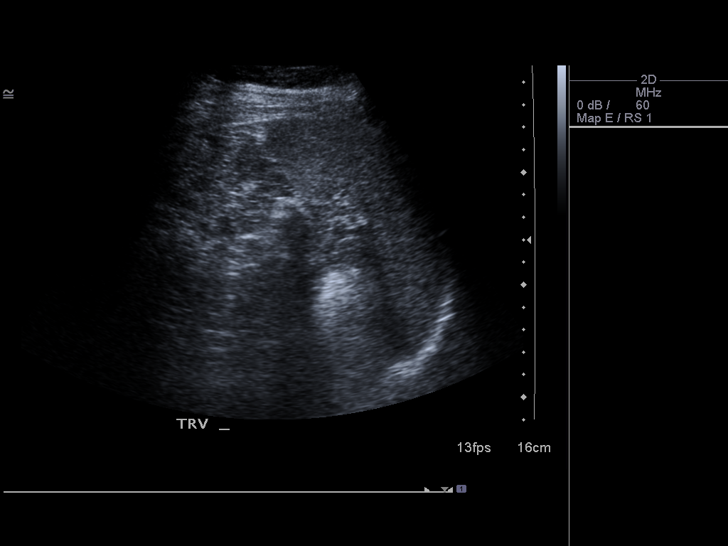

[14 of 25 positions shown; findings below may reference images not displayed]

FINDINGS: Gallbladder:  Normal, without wall thickening, stone, or
pericholecystic fluid.  Sonographic Murphy's sign was not elicited.

Common bile duct: Normal, at 3 mm.

Liver: Normal in echogenicity, without focal lesion.

IVC: Negative

Pancreas:  Poorly visualized due to overlying bowel gas.

Spleen:  Normal in size and echogenicity.

Right Kidney:  10.1 cm. No hydronephrosis.

Left Kidney:  9.9 cm. No hydronephrosis.

Abdominal aorta:  Partially obscured by bowel gas.  No aneurysm. No
ascites.

At the site of palpable abnormality, is an area of superficial soft
tissue echogenicity.  This appears encapsulated within the
superficial musculature.  Example image 47.
IMPRESSION: 1.  No acute intra-abdominal process.
2.  Palpable abnormality corresponds to an area of possibly
encapsulated soft tissue echogenicity within the superficial
musculature. Nonspecific and suboptimally evaluated at ultrasound.
Consider further evaluation with contrast enhanced CT.

## 2012-11-21 IMAGING — CT CT ABDOMEN W/ CM
2 of 4 series · 16 of 46 positions shown, 18 images · IV contrast (APPLIED)
Comparison: Ultrasound 12/30/2010

CLINICAL DATA: Possible soft tissue mass on ultrasound

CT ABDOMEN WITH CONTRAST
TECHNIQUE: The CT scan of the abdomen was performed after
uneventful administration of 100 ml of Omnipaque

[Series 2: abd/pelvis 5.0 b31f · axial · 0.85mm/px · z∈[+922,+1178]mm · 13 of 57 slices shown, 15 images]
[im 3/57  soft-tissue]
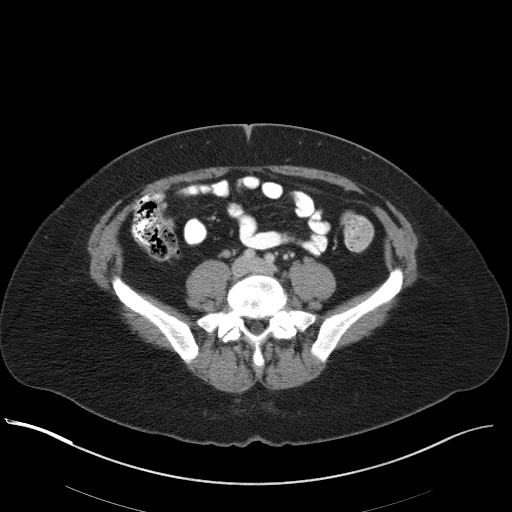
[im 3/57  bone]
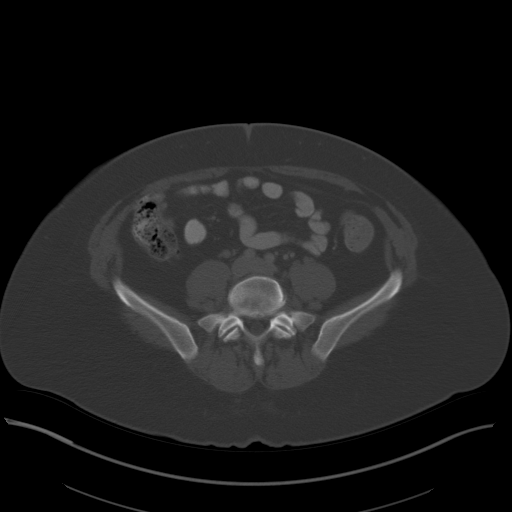
[im 9/57  soft-tissue]
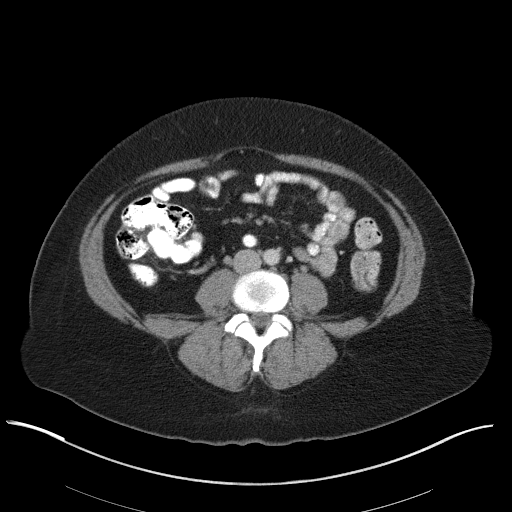
[im 11/57  soft-tissue]
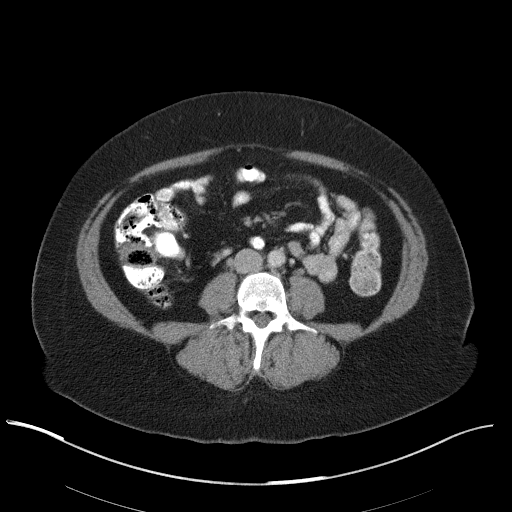
[im 17/57  soft-tissue]
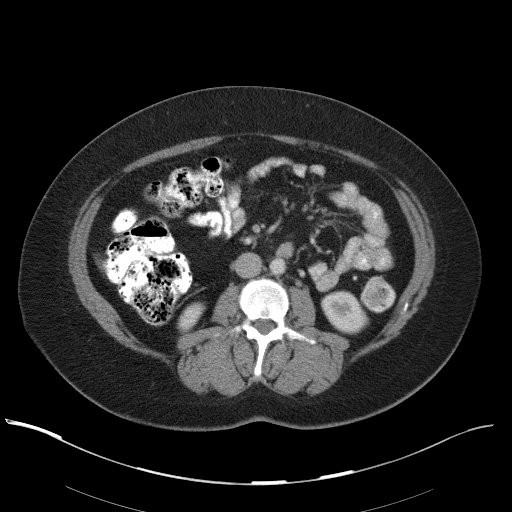
[im 19/57  soft-tissue]
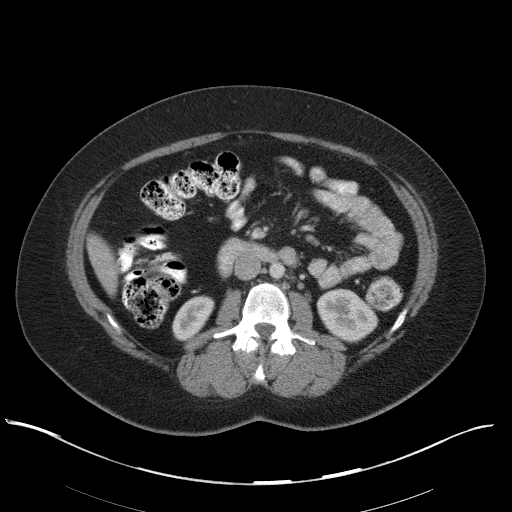
[im 25/57  soft-tissue]
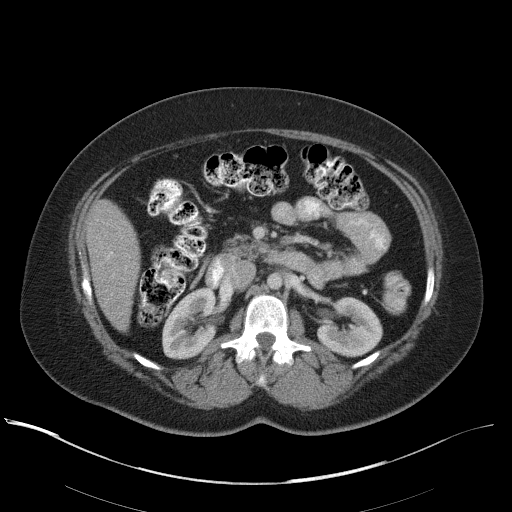
[im 30/57  soft-tissue]
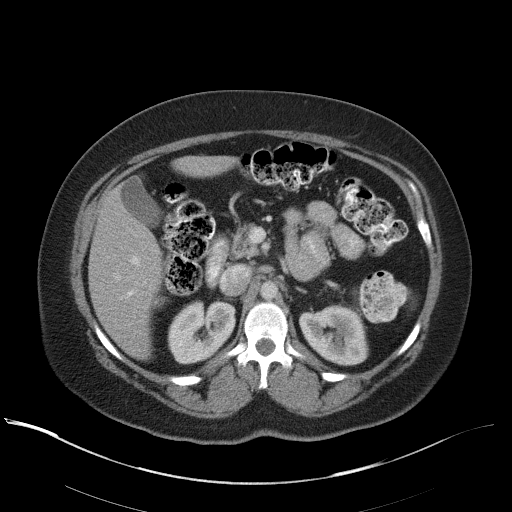
[im 33/57  soft-tissue]
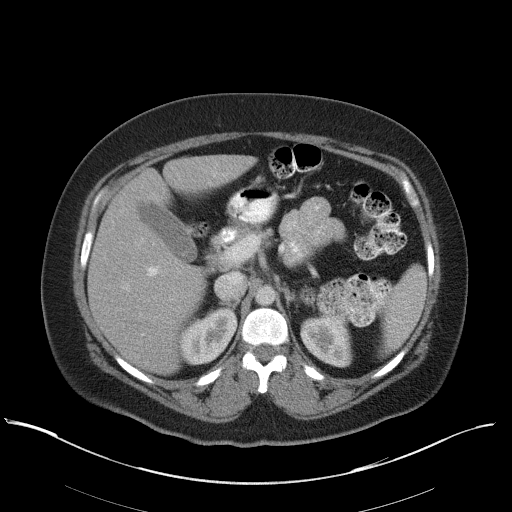
[im 38/57  soft-tissue]
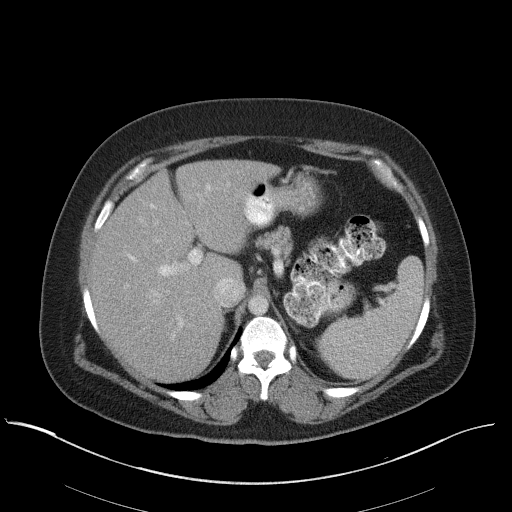
[im 38/57  bone]
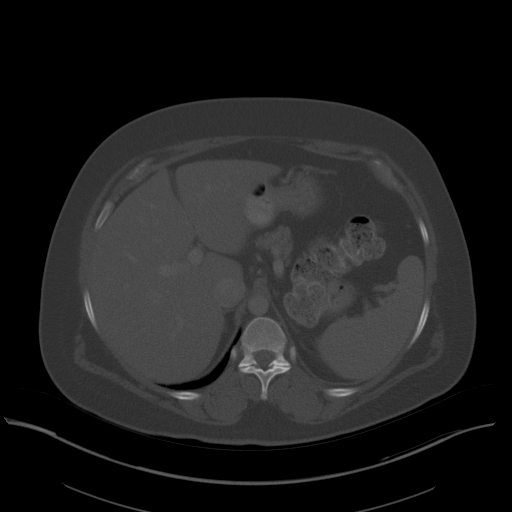
[im 41/57  soft-tissue]
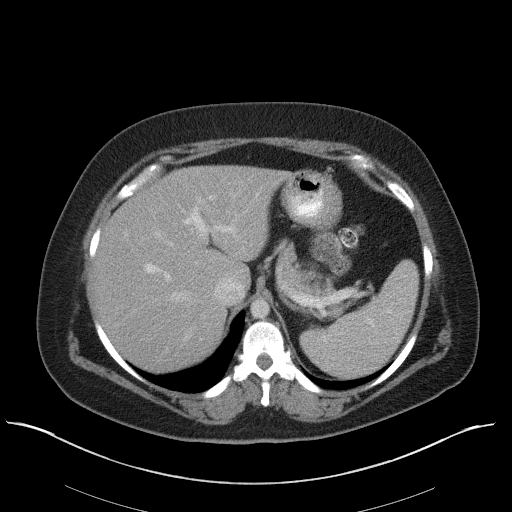
[im 46/57  soft-tissue]
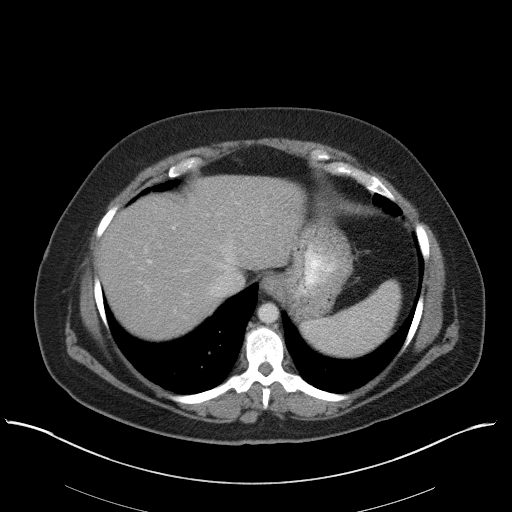
[im 49/57  soft-tissue]
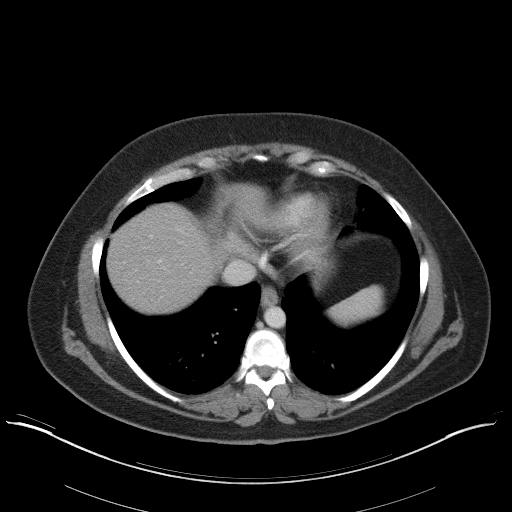
[im 54/57  soft-tissue]
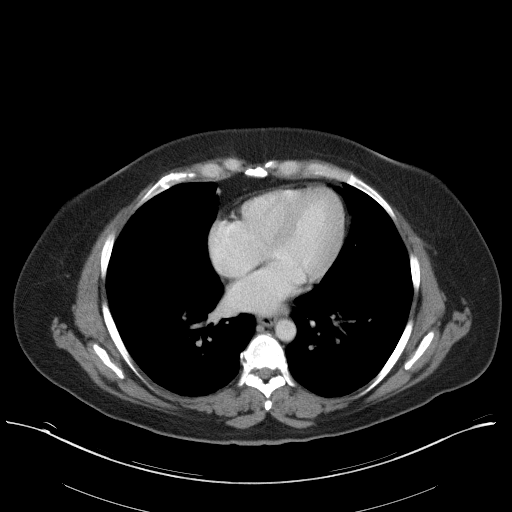

[Series 5: abd/pelvis 3.0 coronal · coronal · 0.58mm/px · 3 of 102 slices shown]
[im 34/102  soft-tissue]
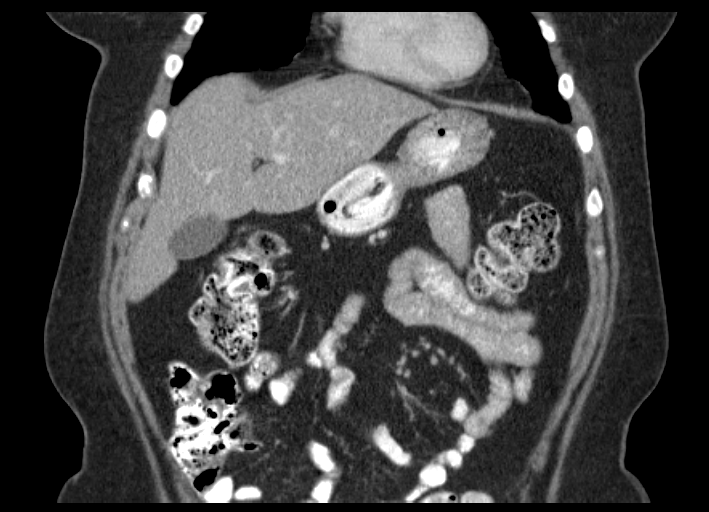
[im 45/102  soft-tissue]
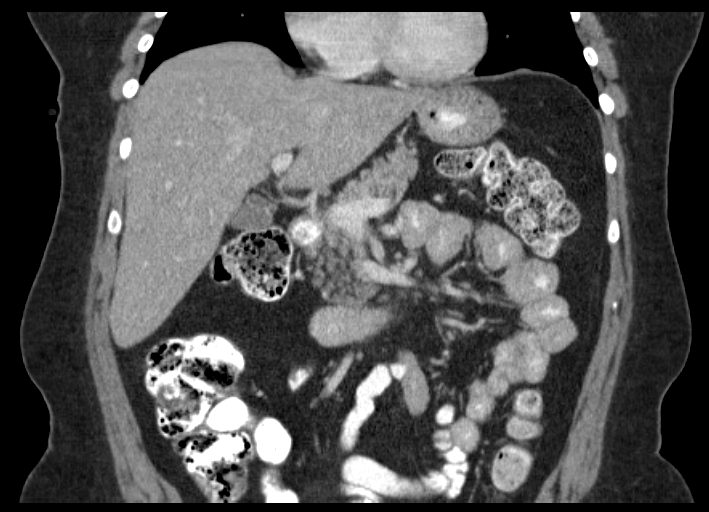
[im 57/102  soft-tissue]
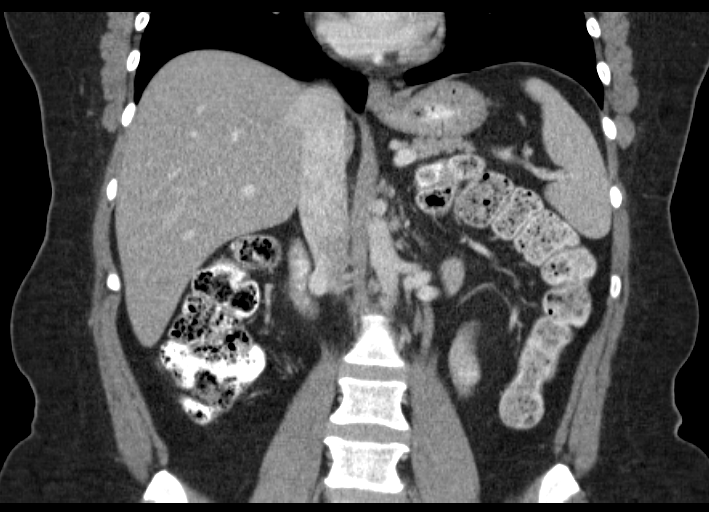

[16 of 46 positions shown; findings below may reference images not displayed]

FINDINGS: A skin markers was applied on the right lateral flank
wall best seen in axial image 12.

No abdominal wall or subcutaneous wall mass is identified.  No
abnormal enhancement.  No fluid collection is noted in this region.

The lung bases are unremarkable.

Sagittal images of the spine shows disc space flattening with
vacuum disc phenomenon at L5-S1 level.  Mild disc bulges noted at
this level.

Enhanced liver, spleen, pancreas and adrenals are unremarkable.
Moderate colonic stool.  No calcified gallstones are noted within
gallbladder.  No aortic aneurysm.

No small bowel obstruction.  No ascites or free air.  No
adenopathy.

The visualized terminal ileum is unremarkable.

No thickened or dilated small bowel loops are noted.

Enhanced kidneys are symmetrical in size.  No hydronephrosis or
hydroureter.

Delayed renal images shows bilateral renal symmetrical excretion.
IMPRESSION: 1.  No abdominal wall or subcutaneous mass is noted.  No abnormal
enhancement.  No fluid collection is noted in the marked region.
2.  No acute intra-abdominal abnormality.
3.  No small bowel obstruction.
4.  No hydronephrosis or hydroureter.

## 2012-12-03 ENCOUNTER — Encounter: Payer: Commercial Managed Care - PPO | Admitting: Family Medicine

## 2013-01-09 ENCOUNTER — Telehealth: Payer: Self-pay

## 2013-01-09 NOTE — Telephone Encounter (Signed)
HM UTD WE: Flu vaccine which patient will receive at work Meds reconciled, pharmacy and allergies verified.

## 2013-01-10 ENCOUNTER — Other Ambulatory Visit (HOSPITAL_COMMUNITY)
Admission: RE | Admit: 2013-01-10 | Discharge: 2013-01-10 | Disposition: A | Discharge status: Home / Self Care | Payer: Commercial Managed Care - PPO | Source: Ambulatory Visit | Attending: Family Medicine | Admitting: Family Medicine

## 2013-01-10 ENCOUNTER — Encounter: Payer: Self-pay | Admitting: Family Medicine

## 2013-01-10 ENCOUNTER — Ambulatory Visit (INDEPENDENT_AMBULATORY_CARE_PROVIDER_SITE_OTHER): Payer: Commercial Managed Care - PPO | Admitting: Family Medicine

## 2013-01-10 VITALS — BP 124/90 | HR 96 | Temp 98.2°F | Ht 65.0 in | Wt 210.4 lb

## 2013-01-10 DIAGNOSIS — N390 Urinary tract infection, site not specified: Secondary | ICD-10-CM

## 2013-01-10 DIAGNOSIS — Z01419 Encounter for gynecological examination (general) (routine) without abnormal findings: Secondary | ICD-10-CM | POA: Insufficient documentation

## 2013-01-10 DIAGNOSIS — Z124 Encounter for screening for malignant neoplasm of cervix: Secondary | ICD-10-CM

## 2013-01-10 DIAGNOSIS — F411 Generalized anxiety disorder: Secondary | ICD-10-CM

## 2013-01-10 DIAGNOSIS — Z0000 Encounter for general adult medical examination without abnormal findings: Secondary | ICD-10-CM

## 2013-01-10 DIAGNOSIS — N841 Polyp of cervix uteri: Secondary | ICD-10-CM

## 2013-01-10 DIAGNOSIS — E669 Obesity, unspecified: Secondary | ICD-10-CM | POA: Insufficient documentation

## 2013-01-10 DIAGNOSIS — Z1151 Encounter for screening for human papillomavirus (HPV): Secondary | ICD-10-CM | POA: Insufficient documentation

## 2013-01-10 LAB — POCT URINALYSIS DIPSTICK
Glucose, UA: NEGATIVE
Protein, UA: NEGATIVE
Spec Grav, UA: 1.01
Urobilinogen, UA: 0.2
pH, UA: 8

## 2013-01-10 LAB — BASIC METABOLIC PANEL
BUN: 11 mg/dL (ref 6–23)
Chloride: 105 mEq/L (ref 96–112)
Creatinine, Ser: 0.9 mg/dL (ref 0.4–1.2)
GFR: 68.83 mL/min (ref >60.00)
Glucose, Bld: 76 mg/dL (ref 70–99)
Potassium: 3.9 mEq/L (ref 3.5–5.1)

## 2013-01-10 LAB — CBC WITH DIFFERENTIAL/PLATELET
Basophils Absolute: 0.1 10*3/uL (ref 0.0–0.1)
Eosinophils Absolute: 0.1 10*3/uL (ref 0.0–0.7)
HCT: 39.7 % (ref 36.0–46.0)
Lymphs Abs: 1.7 10*3/uL (ref 0.7–4.0)
MCV: 84.5 fl (ref 78.0–100.0)
Monocytes Absolute: 0.5 10*3/uL (ref 0.1–1.0)
Neutrophils Relative %: 70 % (ref 43.0–77.0)
Platelets: 255 10*3/uL (ref 150.0–400.0)
RDW: 14.4 % (ref 11.5–14.6)

## 2013-01-10 LAB — LIPID PANEL
Cholesterol: 172 mg/dL (ref 0–200)
HDL: 56 mg/dL (ref >39.00)
LDL Cholesterol: 95 mg/dL (ref 0–99)
Triglycerides: 105 mg/dL (ref 0.0–149.0)
VLDL: 21 mg/dL (ref 0.0–40.0)

## 2013-01-10 LAB — HEPATIC FUNCTION PANEL
Bilirubin, Direct: 0 mg/dL (ref 0.0–0.3)
Total Bilirubin: 0.5 mg/dL (ref 0.3–1.2)

## 2013-01-10 NOTE — Assessment & Plan Note (Signed)
Stable con't meds 

## 2013-01-10 NOTE — Addendum Note (Signed)
Addended by: Silvio Pate D on: 01/10/2013 03:46 PM   Modules accepted: Orders

## 2013-01-10 NOTE — Progress Notes (Signed)
Subjective:     Alexis Zamora is a 42 y.o. female and is here for a comprehensive physical exam. The patient reports no problems.  History   Social History  . Marital Status: Divorced    Spouse Name: N/A    Number of Children: N/A  . Years of Education: N/A   Occupational History  . ER--unit secretary [employer]   Social History Main Topics  . Smoking status: Former Smoker -- 1.50 packs/day for 18 years    Types: Cigarettes    Quit date: 09/21/2003  . Smokeless tobacco: Never Used  . Alcohol Use: No  . Drug Use: No  . Sexual Activity: Not Currently    Partners: Male   Other Topics Concern  . Not on file   Social History Narrative   Exercise-- no   Health Maintenance  Topic Date Due  . Influenza Vaccine  01/10/2014  . Mammogram  10/31/2013  . Pap Smear  09/21/2014  . Tetanus/tdap  09/21/2015    The following portions of the patient's history were reviewed and updated as appropriate:  She  has a past medical history of Anxiety; GERD (gastroesophageal reflux disease); Migraine; and Carpal tunnel syndrome. She  does not have any pertinent problems on file. She  has past surgical history that includes LEEP; Esophageal dilation; and Esophageal manometry. Her family history includes Arthritis in her mother; Colon cancer in an other family member; Diabetes in her father; Heart attack in her maternal grandfather; Hyperlipidemia in her father and mother; Hypertension in her father; Lupus in her mother. She  reports that she quit smoking about 9 years ago. Her smoking use included Cigarettes. She has a 27 pack-year smoking history. She has never used smokeless tobacco. She reports that she does not drink alcohol or use illicit drugs. She has a current medication list which includes the following prescription(s): aspirin, calcium carbonate-vitamin d, clonazepam, docusate sodium, esomeprazole, glucosamine-chondroitin, lorazepam, multivitamin, nortriptyline, and  polyethylene glycol powder. Current Outpatient Prescriptions on File Prior to Visit  Medication Sig Dispense Refill  . aspirin 81 MG tablet Take 81 mg by mouth daily.        . Calcium Carbonate-Vitamin D (CALCIUM 600 + D PO) Take 1 tablet by mouth daily.        . clonazePAM (KLONOPIN) 0.5 MG tablet Take 0.5 mg by mouth 2 (two) times daily as needed.        . docusate sodium (COLACE) 100 MG capsule Take 100 mg by mouth daily.      Marland Kitchen esomeprazole (NEXIUM) 40 MG capsule Take 40 mg by mouth daily before breakfast.        . glucosamine-chondroitin 500-400 MG tablet Take 1 tablet by mouth daily.        Marland Kitchen LORazepam (ATIVAN) 0.5 MG tablet Take 0.5 mg by mouth every 8 (eight) hours.        . Multiple Vitamin (MULTIVITAMIN) tablet Take 1 tablet by mouth daily.        . nortriptyline (PAMELOR) 25 MG capsule Take 125 mg by mouth at bedtime.       . polyethylene glycol powder (GLYCOLAX/MIRALAX) powder Take 17 g by mouth daily.       No current facility-administered medications on file prior to visit.   She has No Known Allergies..  Review of Systems Review of Systems  Constitutional: Negative for activity change, appetite change and fatigue.  HENT: Negative for hearing loss, congestion, tinnitus and ear discharge.  dentist q57m Eyes:  Negative for visual disturbance (see optho q1y -- vision corrected to 20/20 with glasses).  Respiratory: Negative for cough, chest tightness and shortness of breath.   Cardiovascular: Negative for chest pain, palpitations and leg swelling.  Gastrointestinal: Negative for abdominal pain, diarrhea, constipation and abdominal distention.  Genitourinary: Negative for urgency, frequency, decreased urine volume and difficulty urinating.  Musculoskeletal: Negative for back pain, arthralgias and gait problem.  Skin: Negative for color change, pallor and rash.  Neurological: Negative for dizziness, light-headedness, numbness and headaches.  Hematological: Negative for adenopathy.  Does not bruise/bleed easily.  Psychiatric/Behavioral: Negative for suicidal ideas, confusion, sleep disturbance, self-injury, dysphoric mood, decreased concentration and agitation.       Objective:    BP 124/90  Pulse 96  Temp(Src) 98.2 F (36.8 C) (Oral)  Ht 5\' 5"  (1.651 m)  Wt 210 lb 6.4 oz (95.437 kg)  BMI 35.01 kg/m2  SpO2 97%  LMP 12/22/2012 General appearance: alert, cooperative, appears stated age and no distress Head: Normocephalic, without obvious abnormality, atraumatic Eyes: conjunctivae/corneas clear. PERRL, EOM's intact. Fundi benign. Ears: normal TM's and external ear canals both ears Nose: Nares normal. Septum midline. Mucosa normal. No drainage or sinus tenderness. Throat: lips, mucosa, and tongue normal; teeth and gums normal Neck: no adenopathy, no carotid bruit, no JVD, supple, symmetrical, trachea midline and thyroid not enlarged, symmetric, no tenderness/mass/nodules Back: symmetric, no curvature. ROM normal. No CVA tenderness. Lungs: clear to auscultation bilaterally Breasts: normal appearance, no masses or tenderness Heart: regular rate and rhythm, S1, S2 normal, no murmur, click, rub or gallop Abdomen: soft, non-tender; bowel sounds normal; no masses,  no organomegaly Pelvic: cervix normal in appearance, external genitalia normal, no adnexal masses or tenderness, no cervical motion tenderness, rectovaginal septum normal, uterus normal size, shape, and consistency and vagina normal without discharge pap done Extremities: extremities normal, atraumatic, no cyanosis or edema Pulses: 2+ and symmetric Skin: Skin color, texture, turgor normal. No rashes or lesions Lymph nodes: Cervical, supraclavicular, and axillary nodes normal. Neurologic: Alert and oriented X 3, normal strength and tone. Normal symmetric reflexes. Normal coordination and gait Psych- no anxiety, no depressin      Assessment:    Healthy female exam.      Plan:    ghm utd Check  labs See After Visit Summary for Counseling Recommendations

## 2013-01-10 NOTE — Patient Instructions (Addendum)
Preventive Care for Adults, Female A healthy lifestyle and preventive care can promote health and wellness. Preventive health guidelines for women include the following key practices.  A routine yearly physical is a good way to check with your caregiver about your health and preventive screening. It is a chance to share any concerns and updates on your health, and to receive a thorough exam.  Visit your dentist for a routine exam and preventive care every 6 months. Brush your teeth twice a day and floss once a day. Good oral hygiene prevents tooth decay and gum disease.  The frequency of eye exams is based on your age, health, family medical history, use of contact lenses, and other factors. Follow your caregiver's recommendations for frequency of eye exams.  Eat a healthy diet. Foods like vegetables, fruits, whole grains, low-fat dairy products, and lean protein foods contain the nutrients you need without too many calories. Decrease your intake of foods high in solid fats, added sugars, and salt. Eat the right amount of calories for you.Get information about a proper diet from your caregiver, if necessary.  Regular physical exercise is one of the most important things you can do for your health. Most adults should get at least 150 minutes of moderate-intensity exercise (any activity that increases your heart rate and causes you to sweat) each week. In addition, most adults need muscle-strengthening exercises on 2 or more days a week.  Maintain a healthy weight. The body mass index (BMI) is a screening tool to identify possible weight problems. It provides an estimate of body fat based on height and weight. Your caregiver can help determine your BMI, and can help you achieve or maintain a healthy weight.For adults 20 years and older:  A BMI below 18.5 is considered underweight.  A BMI of 18.5 to 24.9 is normal.  A BMI of 25 to 29.9 is considered overweight.  A BMI of 30 and above is  considered obese.  Maintain normal blood lipids and cholesterol levels by exercising and minimizing your intake of saturated fat. Eat a balanced diet with plenty of fruit and vegetables. Blood tests for lipids and cholesterol should begin at age 20 and be repeated every 5 years. If your lipid or cholesterol levels are high, you are over 50, or you are at high risk for heart disease, you may need your cholesterol levels checked more frequently.Ongoing high lipid and cholesterol levels should be treated with medicines if diet and exercise are not effective.  If you smoke, find out from your caregiver how to quit. If you do not use tobacco, do not start.  If you are pregnant, do not drink alcohol. If you are breastfeeding, be very cautious about drinking alcohol. If you are not pregnant and choose to drink alcohol, do not exceed 1 drink per day. One drink is considered to be 12 ounces (355 mL) of beer, 5 ounces (148 mL) of wine, or 1.5 ounces (44 mL) of liquor.  Avoid use of street drugs. Do not share needles with anyone. Ask for help if you need support or instructions about stopping the use of drugs.  High blood pressure causes heart disease and increases the risk of stroke. Your blood pressure should be checked at least every 1 to 2 years. Ongoing high blood pressure should be treated with medicines if weight loss and exercise are not effective.  If you are 55 to 44 years old, ask your caregiver if you should take aspirin to prevent strokes.  Diabetes   screening involves taking a blood sample to check your fasting blood sugar level. This should be done once every 3 years, after age 45, if you are within normal weight and without risk factors for diabetes. Testing should be considered at a younger age or be carried out more frequently if you are overweight and have at least 1 risk factor for diabetes.  Breast cancer screening is essential preventive care for women. You should practice "breast  self-awareness." This means understanding the normal appearance and feel of your breasts and may include breast self-examination. Any changes detected, no matter how small, should be reported to a caregiver. Women in their 20s and 30s should have a clinical breast exam (CBE) by a caregiver as part of a regular health exam every 1 to 3 years. After age 40, women should have a CBE every year. Starting at age 40, women should consider having a mammography (breast X-ray test) every year. Women who have a family history of breast cancer should talk to their caregiver about genetic screening. Women at a high risk of breast cancer should talk to their caregivers about having magnetic resonance imaging (MRI) and a mammography every year.  The Pap test is a screening test for cervical cancer. A Pap test can show cell changes on the cervix that might become cervical cancer if left untreated. A Pap test is a procedure in which cells are obtained and examined from the lower end of the uterus (cervix).  Women should have a Pap test starting at age 21.  Between ages 21 and 29, Pap tests should be repeated every 2 years.  Beginning at age 30, you should have a Pap test every 3 years as long as the past 3 Pap tests have been normal.  Some women have medical problems that increase the chance of getting cervical cancer. Talk to your caregiver about these problems. It is especially important to talk to your caregiver if a new problem develops soon after your last Pap test. In these cases, your caregiver may recommend more frequent screening and Pap tests.  The above recommendations are the same for women who have or have not gotten the vaccine for human papillomavirus (HPV).  If you had a hysterectomy for a problem that was not cancer or a condition that could lead to cancer, then you no longer need Pap tests. Even if you no longer need a Pap test, a regular exam is a good idea to make sure no other problems are  starting.  If you are between ages 65 and 70, and you have had normal Pap tests going back 10 years, you no longer need Pap tests. Even if you no longer need a Pap test, a regular exam is a good idea to make sure no other problems are starting.  If you have had past treatment for cervical cancer or a condition that could lead to cancer, you need Pap tests and screening for cancer for at least 20 years after your treatment.  If Pap tests have been discontinued, risk factors (such as a new sexual partner) need to be reassessed to determine if screening should be resumed.  The HPV test is an additional test that may be used for cervical cancer screening. The HPV test looks for the virus that can cause the cell changes on the cervix. The cells collected during the Pap test can be tested for HPV. The HPV test could be used to screen women aged 30 years and older, and should   be used in women of any age who have unclear Pap test results. After the age of 30, women should have HPV testing at the same frequency as a Pap test.  Colorectal cancer can be detected and often prevented. Most routine colorectal cancer screening begins at the age of 50 and continues through age 75. However, your caregiver may recommend screening at an earlier age if you have risk factors for colon cancer. On a yearly basis, your caregiver may provide home test kits to check for hidden blood in the stool. Use of a small camera at the end of a tube, to directly examine the colon (sigmoidoscopy or colonoscopy), can detect the earliest forms of colorectal cancer. Talk to your caregiver about this at age 50, when routine screening begins. Direct examination of the colon should be repeated every 5 to 10 years through age 75, unless early forms of pre-cancerous polyps or small growths are found.  Hepatitis C blood testing is recommended for all people born from 1945 through 1965 and any individual with known risks for hepatitis C.  Practice  safe sex. Use condoms and avoid high-risk sexual practices to reduce the spread of sexually transmitted infections (STIs). STIs include gonorrhea, chlamydia, syphilis, trichomonas, herpes, HPV, and human immunodeficiency virus (HIV). Herpes, HIV, and HPV are viral illnesses that have no cure. They can result in disability, cancer, and death. Sexually active women aged 25 and younger should be checked for chlamydia. Older women with new or multiple partners should also be tested for chlamydia. Testing for other STIs is recommended if you are sexually active and at increased risk.  Osteoporosis is a disease in which the bones lose minerals and strength with aging. This can result in serious bone fractures. The risk of osteoporosis can be identified using a bone density scan. Women ages 65 and over and women at risk for fractures or osteoporosis should discuss screening with their caregivers. Ask your caregiver whether you should take a calcium supplement or vitamin D to reduce the rate of osteoporosis.  Menopause can be associated with physical symptoms and risks. Hormone replacement therapy is available to decrease symptoms and risks. You should talk to your caregiver about whether hormone replacement therapy is right for you.  Use sunscreen with sun protection factor (SPF) of 30 or more. Apply sunscreen liberally and repeatedly throughout the day. You should seek shade when your shadow is shorter than you. Protect yourself by wearing long sleeves, pants, a wide-brimmed hat, and sunglasses year round, whenever you are outdoors.  Once a month, do a whole body skin exam, using a mirror to look at the skin on your back. Notify your caregiver of new moles, moles that have irregular borders, moles that are larger than a pencil eraser, or moles that have changed in shape or color.  Stay current with required immunizations.  Influenza. You need a dose every fall (or winter). The composition of the flu vaccine  changes each year, so being vaccinated once is not enough.  Pneumococcal polysaccharide. You need 1 to 2 doses if you smoke cigarettes or if you have certain chronic medical conditions. You need 1 dose at age 65 (or older) if you have never been vaccinated.  Tetanus, diphtheria, pertussis (Tdap, Td). Get 1 dose of Tdap vaccine if you are younger than age 65, are over 65 and have contact with an infant, are a healthcare worker, are pregnant, or simply want to be protected from whooping cough. After that, you need a Td   booster dose every 10 years. Consult your caregiver if you have not had at least 3 tetanus and diphtheria-containing shots sometime in your life or have a deep or dirty wound.  HPV. You need this vaccine if you are a woman age 26 or younger. The vaccine is given in 3 doses over 6 months.  Measles, mumps, rubella (MMR). You need at least 1 dose of MMR if you were born in 1957 or later. You may also need a second dose.  Meningococcal. If you are age 19 to 21 and a first-year college student living in a residence hall, or have one of several medical conditions, you need to get vaccinated against meningococcal disease. You may also need additional booster doses.  Zoster (shingles). If you are age 60 or older, you should get this vaccine.  Varicella (chickenpox). If you have never had chickenpox or you were vaccinated but received only 1 dose, talk to your caregiver to find out if you need this vaccine.  Hepatitis A. You need this vaccine if you have a specific risk factor for hepatitis A virus infection or you simply wish to be protected from this disease. The vaccine is usually given as 2 doses, 6 to 18 months apart.  Hepatitis B. You need this vaccine if you have a specific risk factor for hepatitis B virus infection or you simply wish to be protected from this disease. The vaccine is given in 3 doses, usually over 6 months. Preventive Services / Frequency Ages 19 to 39  Blood  pressure check.** / Every 1 to 2 years.  Lipid and cholesterol check.** / Every 5 years beginning at age 20.  Clinical breast exam.** / Every 3 years for women in their 20s and 30s.  Pap test.** / Every 2 years from ages 21 through 29. Every 3 years starting at age 30 through age 65 or 70 with a history of 3 consecutive normal Pap tests.  HPV screening.** / Every 3 years from ages 30 through ages 65 to 70 with a history of 3 consecutive normal Pap tests.  Hepatitis C blood test.** / For any individual with known risks for hepatitis C.  Skin self-exam. / Monthly.  Influenza immunization.** / Every year.  Pneumococcal polysaccharide immunization.** / 1 to 2 doses if you smoke cigarettes or if you have certain chronic medical conditions.  Tetanus, diphtheria, pertussis (Tdap, Td) immunization. / A one-time dose of Tdap vaccine. After that, you need a Td booster dose every 10 years.  HPV immunization. / 3 doses over 6 months, if you are 26 and younger.  Measles, mumps, rubella (MMR) immunization. / You need at least 1 dose of MMR if you were born in 1957 or later. You may also need a second dose.  Meningococcal immunization. / 1 dose if you are age 19 to 21 and a first-year college student living in a residence hall, or have one of several medical conditions, you need to get vaccinated against meningococcal disease. You may also need additional booster doses.  Varicella immunization.** / Consult your caregiver.  Hepatitis A immunization.** / Consult your caregiver. 2 doses, 6 to 18 months apart.  Hepatitis B immunization.** / Consult your caregiver. 3 doses usually over 6 months. Ages 40 to 64  Blood pressure check.** / Every 1 to 2 years.  Lipid and cholesterol check.** / Every 5 years beginning at age 20.  Clinical breast exam.** / Every year after age 40.  Mammogram.** / Every year beginning at age 40   and continuing for as long as you are in good health. Consult with your  caregiver.  Pap test.** / Every 3 years starting at age 30 through age 65 or 70 with a history of 3 consecutive normal Pap tests.  HPV screening.** / Every 3 years from ages 30 through ages 65 to 70 with a history of 3 consecutive normal Pap tests.  Fecal occult blood test (FOBT) of stool. / Every year beginning at age 50 and continuing until age 75. You may not need to do this test if you get a colonoscopy every 10 years.  Flexible sigmoidoscopy or colonoscopy.** / Every 5 years for a flexible sigmoidoscopy or every 10 years for a colonoscopy beginning at age 50 and continuing until age 75.  Hepatitis C blood test.** / For all people born from 1945 through 1965 and any individual with known risks for hepatitis C.  Skin self-exam. / Monthly.  Influenza immunization.** / Every year.  Pneumococcal polysaccharide immunization.** / 1 to 2 doses if you smoke cigarettes or if you have certain chronic medical conditions.  Tetanus, diphtheria, pertussis (Tdap, Td) immunization.** / A one-time dose of Tdap vaccine. After that, you need a Td booster dose every 10 years.  Measles, mumps, rubella (MMR) immunization. / You need at least 1 dose of MMR if you were born in 1957 or later. You may also need a second dose.  Varicella immunization.** / Consult your caregiver.  Meningococcal immunization.** / Consult your caregiver.  Hepatitis A immunization.** / Consult your caregiver. 2 doses, 6 to 18 months apart.  Hepatitis B immunization.** / Consult your caregiver. 3 doses, usually over 6 months. Ages 65 and over  Blood pressure check.** / Every 1 to 2 years.  Lipid and cholesterol check.** / Every 5 years beginning at age 20.  Clinical breast exam.** / Every year after age 40.  Mammogram.** / Every year beginning at age 40 and continuing for as long as you are in good health. Consult with your caregiver.  Pap test.** / Every 3 years starting at age 30 through age 65 or 70 with a 3  consecutive normal Pap tests. Testing can be stopped between 65 and 70 with 3 consecutive normal Pap tests and no abnormal Pap or HPV tests in the past 10 years.  HPV screening.** / Every 3 years from ages 30 through ages 65 or 70 with a history of 3 consecutive normal Pap tests. Testing can be stopped between 65 and 70 with 3 consecutive normal Pap tests and no abnormal Pap or HPV tests in the past 10 years.  Fecal occult blood test (FOBT) of stool. / Every year beginning at age 50 and continuing until age 75. You may not need to do this test if you get a colonoscopy every 10 years.  Flexible sigmoidoscopy or colonoscopy.** / Every 5 years for a flexible sigmoidoscopy or every 10 years for a colonoscopy beginning at age 50 and continuing until age 75.  Hepatitis C blood test.** / For all people born from 1945 through 1965 and any individual with known risks for hepatitis C.  Osteoporosis screening.** / A one-time screening for women ages 65 and over and women at risk for fractures or osteoporosis.  Skin self-exam. / Monthly.  Influenza immunization.** / Every year.  Pneumococcal polysaccharide immunization.** / 1 dose at age 65 (or older) if you have never been vaccinated.  Tetanus, diphtheria, pertussis (Tdap, Td) immunization. / A one-time dose of Tdap vaccine if you are over   65 and have contact with an infant, are a healthcare worker, or simply want to be protected from whooping cough. After that, you need a Td booster dose every 10 years.  Varicella immunization.** / Consult your caregiver.  Meningococcal immunization.** / Consult your caregiver.  Hepatitis A immunization.** / Consult your caregiver. 2 doses, 6 to 18 months apart.  Hepatitis B immunization.** / Check with your caregiver. 3 doses, usually over 6 months. ** Family history and personal history of risk and conditions may change your caregiver's recommendations. Document Released: 05/30/2001 Document Revised: 06/26/2011  Document Reviewed: 08/29/2010 [employer] Patient Information 2014 [employer].  

## 2013-01-12 LAB — URINE CULTURE: Colony Count: 6000

## 2015-03-22 ENCOUNTER — Encounter: Payer: Self-pay | Admitting: Family Medicine

## 2015-11-26 ENCOUNTER — Ambulatory Visit (INDEPENDENT_AMBULATORY_CARE_PROVIDER_SITE_OTHER): Payer: Commercial Managed Care - PPO | Admitting: Family Medicine

## 2015-11-26 ENCOUNTER — Other Ambulatory Visit (HOSPITAL_COMMUNITY)
Admission: RE | Admit: 2015-11-26 | Discharge: 2015-11-26 | Disposition: A | Discharge status: Home / Self Care | Payer: Commercial Managed Care - PPO | Source: Ambulatory Visit | Attending: Family Medicine | Admitting: Family Medicine

## 2015-11-26 ENCOUNTER — Encounter: Payer: Self-pay | Admitting: Family Medicine

## 2015-11-26 VITALS — BP 142/100 | HR 102 | Temp 98.4°F | Ht 65.0 in | Wt 223.6 lb

## 2015-11-26 DIAGNOSIS — Z124 Encounter for screening for malignant neoplasm of cervix: Secondary | ICD-10-CM | POA: Diagnosis not present

## 2015-11-26 DIAGNOSIS — Z0000 Encounter for general adult medical examination without abnormal findings: Secondary | ICD-10-CM | POA: Diagnosis not present

## 2015-11-26 DIAGNOSIS — Z1151 Encounter for screening for human papillomavirus (HPV): Secondary | ICD-10-CM | POA: Insufficient documentation

## 2015-11-26 DIAGNOSIS — Z01419 Encounter for gynecological examination (general) (routine) without abnormal findings: Secondary | ICD-10-CM | POA: Diagnosis present

## 2015-11-26 LAB — POCT URINALYSIS DIPSTICK
BILIRUBIN UA: NEGATIVE
Blood, UA: NEGATIVE
GLUCOSE UA: NEGATIVE
KETONES UA: NEGATIVE
LEUKOCYTES UA: NEGATIVE
NITRITE UA: NEGATIVE
Protein, UA: NEGATIVE
Spec Grav, UA: 1.01
Urobilinogen, UA: 0.2
pH, UA: 6

## 2015-11-26 NOTE — Progress Notes (Signed)
Pre visit review using our clinic review tool, if applicable. No additional management support is needed unless otherwise documented below in the visit note. 

## 2015-11-26 NOTE — Patient Instructions (Signed)
Preventive Care for Adults, Female A healthy lifestyle and preventive care can promote health and wellness. Preventive health guidelines for women include the following key practices.  A routine yearly physical is a good way to check with your health care provider about your health and preventive screening. It is a chance to share any concerns and updates on your health and to receive a thorough exam.  Visit your dentist for a routine exam and preventive care every 6 months. Brush your teeth twice a day and floss once a day. Good oral hygiene prevents tooth decay and gum disease.  The frequency of eye exams is based on your age, health, family medical history, use of contact lenses, and other factors. Follow your health care provider's recommendations for frequency of eye exams.  Eat a healthy diet. Foods like vegetables, fruits, whole grains, low-fat dairy products, and lean protein foods contain the nutrients you need without too many calories. Decrease your intake of foods high in solid fats, added sugars, and salt. Eat the right amount of calories for you.Get information about a proper diet from your health care provider, if necessary.  Regular physical exercise is one of the most important things you can do for your health. Most adults should get at least 150 minutes of moderate-intensity exercise (any activity that increases your heart rate and causes you to sweat) each week. In addition, most adults need muscle-strengthening exercises on 2 or more days a week.  Maintain a healthy weight. The body mass index (BMI) is a screening tool to identify possible weight problems. It provides an estimate of body fat based on height and weight. Your health care provider can find your BMI and can help you achieve or maintain a healthy weight.For adults 20 years and older:  A BMI below 18.5 is considered underweight.  A BMI of 18.5 to 24.9 is normal.  A BMI of 25 to 29.9 is considered overweight.  A  BMI of 30 and above is considered obese.  Maintain normal blood lipids and cholesterol levels by exercising and minimizing your intake of saturated fat. Eat a balanced diet with plenty of fruit and vegetables. Blood tests for lipids and cholesterol should begin at age 45 and be repeated every 5 years. If your lipid or cholesterol levels are high, you are over 50, or you are at high risk for heart disease, you may need your cholesterol levels checked more frequently.Ongoing high lipid and cholesterol levels should be treated with medicines if diet and exercise are not working.  If you smoke, find out from your health care provider how to quit. If you do not use tobacco, do not start.  Lung cancer screening is recommended for adults aged 45-80 years who are at high risk for developing lung cancer because of a history of smoking. A yearly low-dose CT scan of the lungs is recommended for people who have at least a 30-pack-year history of smoking and are a current smoker or have quit within the past 15 years. A pack year of smoking is smoking an average of 1 pack of cigarettes a day for 1 year (for example: 1 pack a day for 30 years or 2 packs a day for 15 years). Yearly screening should continue until the smoker has stopped smoking for at least 15 years. Yearly screening should be stopped for people who develop a health problem that would prevent them from having lung cancer treatment.  If you are pregnant, do not drink alcohol. If you are  breastfeeding, be very cautious about drinking alcohol. If you are not pregnant and choose to drink alcohol, do not have more than 1 drink per day. One drink is considered to be 12 ounces (355 mL) of beer, 5 ounces (148 mL) of wine, or 1.5 ounces (44 mL) of liquor.  Avoid use of street drugs. Do not share needles with anyone. Ask for help if you need support or instructions about stopping the use of drugs.  High blood pressure causes heart disease and increases the risk  of stroke. Your blood pressure should be checked at least every 1 to 2 years. Ongoing high blood pressure should be treated with medicines if weight loss and exercise do not work.  If you are 55-79 years old, ask your health care provider if you should take aspirin to prevent strokes.  Diabetes screening is done by taking a blood sample to check your blood glucose level after you have not eaten for a certain period of time (fasting). If you are not overweight and you do not have risk factors for diabetes, you should be screened once every 3 years starting at age 45. If you are overweight or obese and you are 40-70 years of age, you should be screened for diabetes every year as part of your cardiovascular risk assessment.  Breast cancer screening is essential preventive care for women. You should practice "breast self-awareness." This means understanding the normal appearance and feel of your breasts and may include breast self-examination. Any changes detected, no matter how small, should be reported to a health care provider. Women in their 20s and 30s should have a clinical breast exam (CBE) by a health care provider as part of a regular health exam every 1 to 3 years. After age 40, women should have a CBE every year. Starting at age 40, women should consider having a mammogram (breast X-ray test) every year. Women who have a family history of breast cancer should talk to their health care provider about genetic screening. Women at a high risk of breast cancer should talk to their health care providers about having an MRI and a mammogram every year.  Breast cancer gene (BRCA)-related cancer risk assessment is recommended for women who have family members with BRCA-related cancers. BRCA-related cancers include breast, ovarian, tubal, and peritoneal cancers. Having family members with these cancers may be associated with an increased risk for harmful changes (mutations) in the breast cancer genes BRCA1 and  BRCA2. Results of the assessment will determine the need for genetic counseling and BRCA1 and BRCA2 testing.  Your health care provider may recommend that you be screened regularly for cancer of the pelvic organs (ovaries, uterus, and vagina). This screening involves a pelvic examination, including checking for microscopic changes to the surface of your cervix (Pap test). You may be encouraged to have this screening done every 3 years, beginning at age 21.  For women ages 30-65, health care providers may recommend pelvic exams and Pap testing every 3 years, or they may recommend the Pap and pelvic exam, combined with testing for human papilloma virus (HPV), every 5 years. Some types of HPV increase your risk of cervical cancer. Testing for HPV may also be done on women of any age with unclear Pap test results.  Other health care providers may not recommend any screening for nonpregnant women who are considered low risk for pelvic cancer and who do not have symptoms. Ask your health care provider if a screening pelvic exam is right for   you.  If you have had past treatment for cervical cancer or a condition that could lead to cancer, you need Pap tests and screening for cancer for at least 20 years after your treatment. If Pap tests have been discontinued, your risk factors (such as having a new sexual partner) need to be reassessed to determine if screening should resume. Some women have medical problems that increase the chance of getting cervical cancer. In these cases, your health care provider may recommend more frequent screening and Pap tests.  Colorectal cancer can be detected and often prevented. Most routine colorectal cancer screening begins at the age of 50 years and continues through age 75 years. However, your health care provider may recommend screening at an earlier age if you have risk factors for colon cancer. On a yearly basis, your health care provider may provide home test kits to check  for hidden blood in the stool. Use of a small camera at the end of a tube, to directly examine the colon (sigmoidoscopy or colonoscopy), can detect the earliest forms of colorectal cancer. Talk to your health care provider about this at age 50, when routine screening begins. Direct exam of the colon should be repeated every 5-10 years through age 75 years, unless early forms of precancerous polyps or small growths are found.  People who are at an increased risk for hepatitis B should be screened for this virus. You are considered at high risk for hepatitis B if:  You were born in a country where hepatitis B occurs often. Talk with your health care provider about which countries are considered high risk.  Your parents were born in a high-risk country and you have not received a shot to protect against hepatitis B (hepatitis B vaccine).  You have HIV or AIDS.  You use needles to inject street drugs.  You live with, or have sex with, someone who has hepatitis B.  You get hemodialysis treatment.  You take certain medicines for conditions like cancer, organ transplantation, and autoimmune conditions.  Hepatitis C blood testing is recommended for all people born from 1945 through 1965 and any individual with known risks for hepatitis C.  Practice safe sex. Use condoms and avoid high-risk sexual practices to reduce the spread of sexually transmitted infections (STIs). STIs include gonorrhea, chlamydia, syphilis, trichomonas, herpes, HPV, and human immunodeficiency virus (HIV). Herpes, HIV, and HPV are viral illnesses that have no cure. They can result in disability, cancer, and death.  You should be screened for sexually transmitted illnesses (STIs) including gonorrhea and chlamydia if:  You are sexually active and are younger than 24 years.  You are older than 24 years and your health care provider tells you that you are at risk for this type of infection.  Your sexual activity has changed  since you were last screened and you are at an increased risk for chlamydia or gonorrhea. Ask your health care provider if you are at risk.  If you are at risk of being infected with HIV, it is recommended that you take a prescription medicine daily to prevent HIV infection. This is called preexposure prophylaxis (PrEP). You are considered at risk if:  You are sexually active and do not regularly use condoms or know the HIV status of your partner(s).  You take drugs by injection.  You are sexually active with a partner who has HIV.  Talk with your health care provider about whether you are at high risk of being infected with HIV. If   you choose to begin PrEP, you should first be tested for HIV. You should then be tested every 3 months for as long as you are taking PrEP.  Osteoporosis is a disease in which the bones lose minerals and strength with aging. This can result in serious bone fractures or breaks. The risk of osteoporosis can be identified using a bone density scan. Women ages 67 years and over and women at risk for fractures or osteoporosis should discuss screening with their health care providers. Ask your health care provider whether you should take a calcium supplement or vitamin D to reduce the rate of osteoporosis.  Menopause can be associated with physical symptoms and risks. Hormone replacement therapy is available to decrease symptoms and risks. You should talk to your health care provider about whether hormone replacement therapy is right for you.  Use sunscreen. Apply sunscreen liberally and repeatedly throughout the day. You should seek shade when your shadow is shorter than you. Protect yourself by wearing long sleeves, pants, a wide-brimmed hat, and sunglasses year round, whenever you are outdoors.  Once a month, do a whole body skin exam, using a mirror to look at the skin on your back. Tell your health care provider of new moles, moles that have irregular borders, moles that  are larger than a pencil eraser, or moles that have changed in shape or color.  Stay current with required vaccines (immunizations).  Influenza vaccine. All adults should be immunized every year.  Tetanus, diphtheria, and acellular pertussis (Td, Tdap) vaccine. Pregnant women should receive 1 dose of Tdap vaccine during each pregnancy. The dose should be obtained regardless of the length of time since the last dose. Immunization is preferred during the 27th-36th week of gestation. An adult who has not previously received Tdap or who does not know her vaccine status should receive 1 dose of Tdap. This initial dose should be followed by tetanus and diphtheria toxoids (Td) booster doses every 10 years. Adults with an unknown or incomplete history of completing a 3-dose immunization series with Td-containing vaccines should begin or complete a primary immunization series including a Tdap dose. Adults should receive a Td booster every 10 years.  Varicella vaccine. An adult without evidence of immunity to varicella should receive 2 doses or a second dose if she has previously received 1 dose. Pregnant females who do not have evidence of immunity should receive the first dose after pregnancy. This first dose should be obtained before leaving the health care facility. The second dose should be obtained 4-8 weeks after the first dose.  Human papillomavirus (HPV) vaccine. Females aged 13-26 years who have not received the vaccine previously should obtain the 3-dose series. The vaccine is not recommended for use in pregnant females. However, pregnancy testing is not needed before receiving a dose. If a female is found to be pregnant after receiving a dose, no treatment is needed. In that case, the remaining doses should be delayed until after the pregnancy. Immunization is recommended for any person with an immunocompromised condition through the age of 61 years if she did not get any or all doses earlier. During the  3-dose series, the second dose should be obtained 4-8 weeks after the first dose. The third dose should be obtained 24 weeks after the first dose and 16 weeks after the second dose.  Zoster vaccine. One dose is recommended for adults aged 30 years or older unless certain conditions are present.  Measles, mumps, and rubella (MMR) vaccine. Adults born  before 1957 generally are considered immune to measles and mumps. Adults born in 1957 or later should have 1 or more doses of MMR vaccine unless there is a contraindication to the vaccine or there is laboratory evidence of immunity to each of the three diseases. A routine second dose of MMR vaccine should be obtained at least 28 days after the first dose for students attending postsecondary schools, health care workers, or international travelers. People who received inactivated measles vaccine or an unknown type of measles vaccine during 1963-1967 should receive 2 doses of MMR vaccine. People who received inactivated mumps vaccine or an unknown type of mumps vaccine before 1979 and are at high risk for mumps infection should consider immunization with 2 doses of MMR vaccine. For females of childbearing age, rubella immunity should be determined. If there is no evidence of immunity, females who are not pregnant should be vaccinated. If there is no evidence of immunity, females who are pregnant should delay immunization until after pregnancy. Unvaccinated health care workers born before 1957 who lack laboratory evidence of measles, mumps, or rubella immunity or laboratory confirmation of disease should consider measles and mumps immunization with 2 doses of MMR vaccine or rubella immunization with 1 dose of MMR vaccine.  Pneumococcal 13-valent conjugate (PCV13) vaccine. When indicated, a person who is uncertain of his immunization history and has no record of immunization should receive the PCV13 vaccine. All adults 65 years of age and older should receive this  vaccine. An adult aged 19 years or older who has certain medical conditions and has not been previously immunized should receive 1 dose of PCV13 vaccine. This PCV13 should be followed with a dose of pneumococcal polysaccharide (PPSV23) vaccine. Adults who are at high risk for pneumococcal disease should obtain the PPSV23 vaccine at least 8 weeks after the dose of PCV13 vaccine. Adults older than 47 years of age who have normal immune system function should obtain the PPSV23 vaccine dose at least 1 year after the dose of PCV13 vaccine.  Pneumococcal polysaccharide (PPSV23) vaccine. When PCV13 is also indicated, PCV13 should be obtained first. All adults aged 65 years and older should be immunized. An adult younger than age 65 years who has certain medical conditions should be immunized. Any person who resides in a nursing home or long-term care facility should be immunized. An adult smoker should be immunized. People with an immunocompromised condition and certain other conditions should receive both PCV13 and PPSV23 vaccines. People with human immunodeficiency virus (HIV) infection should be immunized as soon as possible after diagnosis. Immunization during chemotherapy or radiation therapy should be avoided. Routine use of PPSV23 vaccine is not recommended for American Indians, [place] Natives, or people younger than 65 years unless there are medical conditions that require PPSV23 vaccine. When indicated, people who have unknown immunization and have no record of immunization should receive PPSV23 vaccine. One-time revaccination 5 years after the first dose of PPSV23 is recommended for people aged 19-64 years who have chronic kidney failure, nephrotic syndrome, asplenia, or immunocompromised conditions. People who received 1-2 doses of PPSV23 before age 65 years should receive another dose of PPSV23 vaccine at age 65 years or later if at least 5 years have passed since the previous dose. Doses of PPSV23 are not  needed for people immunized with PPSV23 at or after age 65 years.  Meningococcal vaccine. Adults with asplenia or persistent complement component deficiencies should receive 2 doses of quadrivalent meningococcal conjugate (MenACWY-D) vaccine. The doses should be obtained   at least 2 months apart. Microbiologists working with certain meningococcal bacteria, [place] recruits, people at risk during an outbreak, and people who travel to or live in countries with a high rate of meningitis should be immunized. A first-year college student up through age 34 years who is living in a residence hall should receive a dose if she did not receive a dose on or after her 16th birthday. Adults who have certain high-risk conditions should receive one or more doses of vaccine.  Hepatitis A vaccine. Adults who wish to be protected from this disease, have certain high-risk conditions, work with hepatitis A-infected animals, work in hepatitis A research labs, or travel to or work in countries with a high rate of hepatitis A should be immunized. Adults who were previously unvaccinated and who anticipate close contact with an international adoptee during the first 60 days after arrival in the [place] from a country with a high rate of hepatitis A should be immunized.  Hepatitis B vaccine. Adults who wish to be protected from this disease, have certain high-risk conditions, may be exposed to blood or other infectious body fluids, are household contacts or sex partners of hepatitis B positive people, are clients or workers in certain care facilities, or travel to or work in countries with a high rate of hepatitis B should be immunized.  Haemophilus influenzae type b (Hib) vaccine. A previously unvaccinated person with asplenia or sickle cell disease or having a scheduled splenectomy should receive 1 dose of Hib vaccine. Regardless of previous immunization, a recipient of a hematopoietic stem cell transplant should receive a  3-dose series 6-12 months after her successful transplant. Hib vaccine is not recommended for adults with HIV infection. Preventive Services / Frequency Ages 35 to 4 years  Blood pressure check.** / Every 3-5 years.  Lipid and cholesterol check.** / Every 5 years beginning at age 60.  Clinical breast exam.** / Every 3 years for women in their 71s and 10s.  BRCA-related cancer risk assessment.** / For women who have family members with a BRCA-related cancer (breast, ovarian, tubal, or peritoneal cancers).  Pap test.** / Every 2 years from ages 76 through 26. Every 3 years starting at age 61 through age 76 or 93 with a history of 3 consecutive normal Pap tests.  HPV screening.** / Every 3 years from ages 37 through ages 60 to 51 with a history of 3 consecutive normal Pap tests.  Hepatitis C blood test.** / For any individual with known risks for hepatitis C.  Skin self-exam. / Monthly.  Influenza vaccine. / Every year.  Tetanus, diphtheria, and acellular pertussis (Tdap, Td) vaccine.** / Consult your health care provider. Pregnant women should receive 1 dose of Tdap vaccine during each pregnancy. 1 dose of Td every 10 years.  Varicella vaccine.** / Consult your health care provider. Pregnant females who do not have evidence of immunity should receive the first dose after pregnancy.  HPV vaccine. / 3 doses over 6 months, if 93 and younger. The vaccine is not recommended for use in pregnant females. However, pregnancy testing is not needed before receiving a dose.  Measles, mumps, rubella (MMR) vaccine.** / You need at least 1 dose of MMR if you were born in 1957 or later. You may also need a 2nd dose. For females of childbearing age, rubella immunity should be determined. If there is no evidence of immunity, females who are not pregnant should be vaccinated. If there is no evidence of immunity, females who are  pregnant should delay immunization until after pregnancy.  Pneumococcal  13-valent conjugate (PCV13) vaccine.** / Consult your health care provider.  Pneumococcal polysaccharide (PPSV23) vaccine.** / 1 to 2 doses if you smoke cigarettes or if you have certain conditions.  Meningococcal vaccine.** / 1 dose if you are age 68 to 8 years and a Market researcher living in a residence hall, or have one of several medical conditions, you need to get vaccinated against meningococcal disease. You may also need additional booster doses.  Hepatitis A vaccine.** / Consult your health care provider.  Hepatitis B vaccine.** / Consult your health care provider.  Haemophilus influenzae type b (Hib) vaccine.** / Consult your health care provider. Ages 7 to 53 years  Blood pressure check.** / Every year.  Lipid and cholesterol check.** / Every 5 years beginning at age 25 years.  Lung cancer screening. / Every year if you are aged 11-80 years and have a 30-pack-year history of smoking and currently smoke or have quit within the past 15 years. Yearly screening is stopped once you have quit smoking for at least 15 years or develop a health problem that would prevent you from having lung cancer treatment.  Clinical breast exam.** / Every year after age 48 years.  BRCA-related cancer risk assessment.** / For women who have family members with a BRCA-related cancer (breast, ovarian, tubal, or peritoneal cancers).  Mammogram.** / Every year beginning at age 41 years and continuing for as long as you are in good health. Consult with your health care provider.  Pap test.** / Every 3 years starting at age 65 years through age 37 or 70 years with a history of 3 consecutive normal Pap tests.  HPV screening.** / Every 3 years from ages 72 years through ages 60 to 40 years with a history of 3 consecutive normal Pap tests.  Fecal occult blood test (FOBT) of stool. / Every year beginning at age 21 years and continuing until age 5 years. You may not need to do this test if you get  a colonoscopy every 10 years.  Flexible sigmoidoscopy or colonoscopy.** / Every 5 years for a flexible sigmoidoscopy or every 10 years for a colonoscopy beginning at age 35 years and continuing until age 48 years.  Hepatitis C blood test.** / For all people born from [birth year] through 1965 and any individual with known risks for hepatitis C.  Skin self-exam. / Monthly.  Influenza vaccine. / Every year.  Tetanus, diphtheria, and acellular pertussis (Tdap/Td) vaccine.** / Consult your health care provider. Pregnant women should receive 1 dose of Tdap vaccine during each pregnancy. 1 dose of Td every 10 years.  Varicella vaccine.** / Consult your health care provider. Pregnant females who do not have evidence of immunity should receive the first dose after pregnancy.  Zoster vaccine.** / 1 dose for adults aged 30 years or older.  Measles, mumps, rubella (MMR) vaccine.** / You need at least 1 dose of MMR if you were born in 1957 or later. You may also need a second dose. For females of childbearing age, rubella immunity should be determined. If there is no evidence of immunity, females who are not pregnant should be vaccinated. If there is no evidence of immunity, females who are pregnant should delay immunization until after pregnancy.  Pneumococcal 13-valent conjugate (PCV13) vaccine.** / Consult your health care provider.  Pneumococcal polysaccharide (PPSV23) vaccine.** / 1 to 2 doses if you smoke cigarettes or if you have certain conditions.  Meningococcal vaccine.** /  Consult your health care provider.  Hepatitis A vaccine.** / Consult your health care provider.  Hepatitis B vaccine.** / Consult your health care provider.  Haemophilus influenzae type b (Hib) vaccine.** / Consult your health care provider. Ages 64 years and over  Blood pressure check.** / Every year.  Lipid and cholesterol check.** / Every 5 years beginning at age 23 years.  Lung cancer screening. / Every year if you  are aged 16-80 years and have a 30-pack-year history of smoking and currently smoke or have quit within the past 15 years. Yearly screening is stopped once you have quit smoking for at least 15 years or develop a health problem that would prevent you from having lung cancer treatment.  Clinical breast exam.** / Every year after age 74 years.  BRCA-related cancer risk assessment.** / For women who have family members with a BRCA-related cancer (breast, ovarian, tubal, or peritoneal cancers).  Mammogram.** / Every year beginning at age 44 years and continuing for as long as you are in good health. Consult with your health care provider.  Pap test.** / Every 3 years starting at age 58 years through age 22 or 39 years with 3 consecutive normal Pap tests. Testing can be stopped between 65 and 70 years with 3 consecutive normal Pap tests and no abnormal Pap or HPV tests in the past 10 years.  HPV screening.** / Every 3 years from ages 64 years through ages 70 or 61 years with a history of 3 consecutive normal Pap tests. Testing can be stopped between 65 and 70 years with 3 consecutive normal Pap tests and no abnormal Pap or HPV tests in the past 10 years.  Fecal occult blood test (FOBT) of stool. / Every year beginning at age 40 years and continuing until age 27 years. You may not need to do this test if you get a colonoscopy every 10 years.  Flexible sigmoidoscopy or colonoscopy.** / Every 5 years for a flexible sigmoidoscopy or every 10 years for a colonoscopy beginning at age 7 years and continuing until age 32 years.  Hepatitis C blood test.** / For all people born from [birth year] through 1967 and any individual with known risks for hepatitis C.  Osteoporosis screening.** / A one-time screening for women ages 30 years and over and women at risk for fractures or osteoporosis.  Skin self-exam. / Monthly.  Influenza vaccine. / Every year.  Tetanus, diphtheria, and acellular pertussis (Tdap/Td)  vaccine.** / 1 dose of Td every 10 years.  Varicella vaccine.** / Consult your health care provider.  Zoster vaccine.** / 1 dose for adults aged 35 years or older.  Pneumococcal 13-valent conjugate (PCV13) vaccine.** / Consult your health care provider.  Pneumococcal polysaccharide (PPSV23) vaccine.** / 1 dose for all adults aged 46 years and older.  Meningococcal vaccine.** / Consult your health care provider.  Hepatitis A vaccine.** / Consult your health care provider.  Hepatitis B vaccine.** / Consult your health care provider.  Haemophilus influenzae type b (Hib) vaccine.** / Consult your health care provider. ** Family history and personal history of risk and conditions may change your health care provider's recommendations.   This information is not intended to replace advice given to you by your health care provider. Make sure you discuss any questions you have with your health care provider.   Document Released: 05/30/2001 Document Revised: 04/24/2014 Document Reviewed: 08/29/2010 Elsevier Interactive Patient Education [employer].

## 2015-11-26 NOTE — Progress Notes (Signed)
Patient ID: Alexis Zamora, female    DOB: 08-May-1970  Age: 46 y.o. MRN: [id number]    Subjective:  Subjective  HPI Alexis Zamora presents for cpe.  No complaints.    Review of Systems  Constitutional: Negative for appetite change, diaphoresis, fatigue, fever and unexpected weight change.  HENT: Negative for congestion.   Eyes: Negative for pain, redness and visual disturbance.  Respiratory: Negative for cough, chest tightness, shortness of breath and wheezing.   Cardiovascular: Negative for chest pain, palpitations and leg swelling.  Gastrointestinal: Negative for abdominal pain, blood in stool and nausea.  Endocrine: Negative for cold intolerance, heat intolerance, polydipsia, polyphagia and polyuria.  Genitourinary: Negative for difficulty urinating, dysuria and frequency.  Skin: Negative for rash.  Allergic/Immunologic: Negative for environmental allergies.  Neurological: Negative for dizziness, light-headedness, numbness and headaches.  Psychiatric/Behavioral: The patient is not nervous/anxious.     History Past Medical History:  Diagnosis Date  . Anxiety   . Carpal tunnel syndrome   . GERD (gastroesophageal reflux disease)   . Migraine     She has a past surgical history that includes LEEP; Esophageal dilation; and Esophageal manometry.   Her family history includes Arthritis in her mother; Diabetes in her father; Heart attack in her maternal grandfather; Hyperlipidemia in her father and mother; Hypertension in her father; Lupus in her mother.She reports that she quit smoking about 12 years ago. Her smoking use included Cigarettes. She has a 27.00 pack-year smoking history. She has never used smokeless tobacco. She reports that she does not drink alcohol or use drugs.  Current Outpatient Prescriptions on File Prior to Visit  Medication Sig Dispense Refill  . aspirin 81 MG tablet Take 81 mg by mouth daily.      . Calcium Carbonate-Vitamin D (CALCIUM 600 + D PO) Take 1 tablet  by mouth daily.      Marland Kitchen LORazepam (ATIVAN) 0.5 MG tablet Take 0.5 mg by mouth as needed.     . Multiple Vitamin (MULTIVITAMIN) tablet Take 1 tablet by mouth daily.      . nortriptyline (PAMELOR) 25 MG capsule Take 125 mg by mouth at bedtime.     . polyethylene glycol powder (GLYCOLAX/MIRALAX) powder Take 17 g by mouth daily.     No current facility-administered medications on file prior to visit.      Objective:  Objective  Physical Exam  Constitutional: She is oriented to person, place, and time. She appears well-developed and well-nourished.  HENT:  Head: Normocephalic and atraumatic.  Eyes: Conjunctivae and EOM are normal.  Neck: Normal range of motion. Neck supple. No JVD present. Carotid bruit is not present. No thyromegaly present.  Cardiovascular: Normal rate, regular rhythm and normal heart sounds.   No murmur heard. Pulmonary/Chest: Effort normal and breath sounds normal. No respiratory distress. She has no wheezes. She has no rales. She exhibits no tenderness.  Abdominal: Soft. Bowel sounds are normal. She exhibits no distension and no mass. There is no tenderness. There is no rebound and no guarding.  Genitourinary: Vagina normal and uterus normal. Rectal exam shows no external hemorrhoid, no internal hemorrhoid, no mass, no tenderness, anal tone normal and guaiac negative stool. No breast swelling, discharge or bleeding. Pelvic exam was performed with patient supine. No labial fusion. There is no rash, tenderness, lesion or injury on the right labia. There is no rash, tenderness, lesion or injury on the left labia. Uterus is not deviated, not enlarged, not fixed and not tender. Cervix exhibits no  motion tenderness, no discharge and no friability. Right adnexum displays no mass, no tenderness and no fullness. Left adnexum displays no mass, no tenderness and no fullness. No erythema, tenderness or bleeding in the vagina. No foreign body in the vagina. No signs of injury around the  vagina. No vaginal discharge found.  Musculoskeletal: She exhibits no edema.  Neurological: She is alert and oriented to person, place, and time.  Psychiatric: She has a normal mood and affect. Her behavior is normal. Judgment and thought content normal.  Nursing note and vitals reviewed.  BP (!) 142/100 (BP Location: Left Arm, Patient Position: Sitting, Cuff Size: Large)   Pulse (!) 102   Temp 98.4 F (36.9 C)   Ht '5\' 5"'  (1.651 m)   Wt 223 lb 9.6 oz (101.4 kg)   LMP 11/09/2015   SpO2 99%   BMI 37.21 kg/m  Wt Readings from Last 3 Encounters:  11/26/15 223 lb 9.6 oz (101.4 kg)  01/10/13 210 lb 6.4 oz (95.4 kg)  09/21/11 204 lb 6.4 oz (92.7 kg)     Lab Results  Component Value Date   WBC 7.8 01/10/2013   HGB 13.3 01/10/2013   HCT 39.7 01/10/2013   PLT 255.0 01/10/2013   GLUCOSE 76 01/10/2013   CHOL 172 01/10/2013   TRIG 105.0 01/10/2013   HDL 56.00 01/10/2013   LDLCALC 95 01/10/2013   ALT 18 01/10/2013   AST 18 01/10/2013   NA 138 01/10/2013   K 3.9 01/10/2013   CL 105 01/10/2013   CREATININE 0.9 01/10/2013   BUN 11 01/10/2013   CO2 26 01/10/2013   TSH 0.89 01/10/2013    No results found.   Assessment & Plan:  Plan  I have discontinued Ms. Delahoussaye's esomeprazole, glucosamine-chondroitin, and docusate sodium. I am also having her maintain her nortriptyline, aspirin, Calcium Carbonate-Vitamin D (CALCIUM 600 + D PO), multivitamin, LORazepam, polyethylene glycol powder, VYVANSE, clonazePAM, and Esomeprazole Magnesium.  Meds ordered this encounter  Medications  . VYVANSE 40 MG capsule    Sig: Take 1 tablet by mouth daily.    Refill:  0  . clonazePAM (KLONOPIN) 0.25 MG disintegrating tablet    Sig: Take 0.25 mg by mouth 2 (two) times daily as needed for seizure.  . Esomeprazole Magnesium 20 MG TBEC    Sig: Take 1 tablet by mouth daily.    Problem List Items Addressed This Visit      Unprioritized   Preventative health care - Primary    Check labs ghm utd See  AVS      Relevant Orders   POCT urinalysis dipstick (Completed)   Comp Met (CMET)   Lipid panel   CBC w/Diff   TSH   Magnesium   Cytology - PAP    Other Visit Diagnoses    Screening for malignant neoplasm of cervix       Relevant Orders   Cytology - PAP      Follow-up: Return in about 1 year (around 11/25/2016) for annual exam, fasting.  Ann Held, DO

## 2015-11-26 NOTE — Assessment & Plan Note (Signed)
Check labs ghm utd See AVS 

## 2015-11-29 ENCOUNTER — Other Ambulatory Visit (INDEPENDENT_AMBULATORY_CARE_PROVIDER_SITE_OTHER): Payer: Commercial Managed Care - PPO

## 2015-11-29 DIAGNOSIS — Z0000 Encounter for general adult medical examination without abnormal findings: Secondary | ICD-10-CM

## 2015-11-29 LAB — MAGNESIUM: Magnesium: 1.9 mg/dL (ref 1.5–2.5)

## 2015-11-29 LAB — COMPREHENSIVE METABOLIC PANEL
ALK PHOS: 71 U/L (ref 39–117)
ALT: 30 U/L (ref 0–35)
AST: 27 U/L (ref 0–37)
Albumin: 4.2 g/dL (ref 3.5–5.2)
BILIRUBIN TOTAL: 0.3 mg/dL (ref 0.2–1.2)
BUN: 16 mg/dL (ref 6–23)
CO2: 27 meq/L (ref 19–32)
CREATININE: 0.99 mg/dL (ref 0.40–1.20)
Calcium: 10 mg/dL (ref 8.4–10.5)
Chloride: 102 mEq/L (ref 96–112)
GFR: 64 mL/min (ref >60.00)
GLUCOSE: 80 mg/dL (ref 70–99)
Potassium: 3.7 mEq/L (ref 3.5–5.1)
Sodium: 138 mEq/L (ref 135–145)
TOTAL PROTEIN: 7.5 g/dL (ref 6.0–8.3)

## 2015-11-29 LAB — CBC WITH DIFFERENTIAL/PLATELET
BASOS PCT: 0.6 % (ref 0.0–3.0)
Basophils Absolute: 0.1 10*3/uL (ref 0.0–0.1)
EOS ABS: 0.1 10*3/uL (ref 0.0–0.7)
EOS PCT: 1.7 % (ref 0.0–5.0)
HEMATOCRIT: 39.3 % (ref 36.0–46.0)
HEMOGLOBIN: 13.3 g/dL (ref 12.0–15.0)
LYMPHS PCT: 21.6 % (ref 12.0–46.0)
Lymphs Abs: 1.8 10*3/uL (ref 0.7–4.0)
MCHC: 33.7 g/dL (ref 30.0–36.0)
MCV: 82.3 fl (ref 78.0–100.0)
Monocytes Absolute: 1 10*3/uL (ref 0.1–1.0)
Monocytes Relative: 12.2 % — ABNORMAL HIGH (ref 3.0–12.0)
NEUTROS ABS: 5.3 10*3/uL (ref 1.4–7.7)
Neutrophils Relative %: 63.9 % (ref 43.0–77.0)
PLATELETS: 273 10*3/uL (ref 150.0–400.0)
RBC: 4.78 Mil/uL (ref 3.87–5.11)
RDW: 15 % (ref 11.5–15.5)
WBC: 8.4 10*3/uL (ref 4.0–10.5)

## 2015-11-29 LAB — LIPID PANEL
CHOL/HDL RATIO: 3
Cholesterol: 173 mg/dL (ref 0–200)
HDL: 65.1 mg/dL (ref >39.00)
LDL Cholesterol: 89 mg/dL (ref 0–99)
NONHDL: 107.96
Triglycerides: 96 mg/dL (ref 0.0–149.0)
VLDL: 19.2 mg/dL (ref 0.0–40.0)

## 2015-11-29 LAB — TSH: TSH: 5.24 u[IU]/mL — ABNORMAL HIGH (ref 0.35–4.50)

## 2015-12-01 LAB — CYTOLOGY - PAP

## 2015-12-10 ENCOUNTER — Other Ambulatory Visit (INDEPENDENT_AMBULATORY_CARE_PROVIDER_SITE_OTHER): Payer: Commercial Managed Care - PPO

## 2015-12-10 DIAGNOSIS — E039 Hypothyroidism, unspecified: Secondary | ICD-10-CM

## 2015-12-10 LAB — THYROID PANEL WITH TSH
Free Thyroxine Index: 2.1 (ref 1.4–3.8)
T3 UPTAKE: 31 % (ref 22–35)
T4 TOTAL: 6.8 ug/dL (ref 4.5–12.0)
TSH: 2.05 m[IU]/L

## 2016-03-16 LAB — HM MAMMOGRAPHY

## 2016-03-21 ENCOUNTER — Encounter: Payer: Self-pay | Admitting: Family Medicine

## 2016-03-21 LAB — HM MAMMOGRAPHY

## 2016-03-21 NOTE — Progress Notes (Signed)
The results of the exam were inconclusive.   [facility]

## 2016-04-24 ENCOUNTER — Encounter: Payer: Self-pay | Admitting: Family Medicine

## 2017-05-14 LAB — HM MAMMOGRAPHY

## 2017-06-18 ENCOUNTER — Encounter: Payer: Self-pay | Admitting: *Deleted

## 2018-03-04 ENCOUNTER — Other Ambulatory Visit: Payer: Self-pay

## 2018-03-04 MED ORDER — NORTRIPTYLINE HCL 25 MG PO CAPS: 125.0000 mg | ORAL_CAPSULE | Freq: Every day | ORAL | 1 refills | Status: DC

## 2018-03-04 MED ORDER — CLONAZEPAM 0.25 MG PO TBDP: 0.2500 mg | ORAL_TABLET | Freq: Every day | ORAL | 1 refills | Status: DC

## 2018-03-04 MED ORDER — CLONAZEPAM 0.5 MG PO TABS: ORAL_TABLET | ORAL | 1 refills | Status: DC

## 2018-04-14 ENCOUNTER — Encounter: Payer: Self-pay | Admitting: Emergency Medicine

## 2018-04-14 DIAGNOSIS — F4001 Agoraphobia with panic disorder: Secondary | ICD-10-CM | POA: Insufficient documentation

## 2018-04-14 DIAGNOSIS — F988 Other specified behavioral and emotional disorders with onset usually occurring in childhood and adolescence: Secondary | ICD-10-CM

## 2018-04-25 ENCOUNTER — Encounter: Payer: Self-pay | Admitting: Psychiatry

## 2018-04-25 ENCOUNTER — Ambulatory Visit: Payer: No Typology Code available for payment source | Admitting: Psychiatry

## 2018-04-25 VITALS — BP 180/113 | HR 110

## 2018-04-25 DIAGNOSIS — F4001 Agoraphobia with panic disorder: Secondary | ICD-10-CM

## 2018-04-25 DIAGNOSIS — F900 Attention-deficit hyperactivity disorder, predominantly inattentive type: Secondary | ICD-10-CM | POA: Diagnosis not present

## 2018-04-25 MED ORDER — LISDEXAMFETAMINE DIMESYLATE 40 MG PO CAPS: 40.0000 mg | ORAL_CAPSULE | ORAL | 0 refills | Status: DC

## 2018-04-25 MED ORDER — VYVANSE 40 MG PO CAPS: 40.0000 mg | ORAL_CAPSULE | Freq: Every day | ORAL | 0 refills | Status: DC

## 2018-04-25 MED ORDER — CLONAZEPAM 0.5 MG PO TABS: ORAL_TABLET | ORAL | 5 refills | Status: DC

## 2018-04-25 MED ORDER — NORTRIPTYLINE HCL 25 MG PO CAPS: 125.0000 mg | ORAL_CAPSULE | Freq: Every day | ORAL | 5 refills | Status: DC

## 2018-04-25 NOTE — Progress Notes (Signed)
Alexis Zamora [id number] [birth year] 48 y.o.  Subjective:   Patient ID:  Alexis Zamora is a 48 y.o. (DOB Jan 11, 1971) female.  Chief Complaint:  Chief Complaint  Patient presents with  . Follow-up    Medication management    HPI  Last seen July 25 Alexis Zamora presents to the office today for follow-up of panic and ADD.  Stressed with teens, job, holidays.  A little panicky when tried to reduce clonazepam to 0.125 for a few times ans so went back up.  Sleep ok with clonazepam.  OW no panic.  No sx from high blood pressure today.  She checks it often and it's ok.  Was worse before psych meds.  Patient reports stable mood and denies depressed or irritable moods.  Patient denies any recent difficulty with anxiety.  Patient denies difficulty with sleep initiation or maintenance. Denies appetite disturbance.  Patient reports that energy and motivation have been good.  Patient denies any difficulty with concentration.  Patient denies any suicidal ideation.  Vyvanse works well for attention and focus.  12 hour shifts and constant volume.  BP at home today 130/90 Review of Systems:  Review of Systems  Neurological: Negative for tremors and weakness.  Psychiatric/Behavioral: Negative for agitation, behavioral problems, confusion, decreased concentration, dysphoric mood, hallucinations, self-injury, sleep disturbance and suicidal ideas. The patient is not nervous/anxious and is not hyperactive.     Medications: I have reviewed the patient's current medications.  Current Outpatient Medications  Medication Sig Dispense Refill  . aspirin 81 MG tablet Take 81 mg by mouth daily.      . Calcium Carbonate-Vitamin D (CALCIUM 600 + D PO) Take 1 tablet by mouth daily.      . clonazePAM (KLONOPIN) 0.5 MG tablet Take 1/2 tab (0.25mg ) to 1 tab (0.5mg ) at Bedtime. 30 tablet 1  . Multiple Vitamin (MULTIVITAMIN) tablet Take 1 tablet by mouth daily.      . nortriptyline (PAMELOR) 25 MG capsule Take 5  capsules (125 mg total) by mouth at bedtime. 150 capsule 1  . polyethylene glycol powder (GLYCOLAX/MIRALAX) powder Take 17 g by mouth daily.    Marland Kitchen VYVANSE 40 MG capsule Take 1 tablet by mouth daily.  0  . Esomeprazole Magnesium 20 MG TBEC Take 1 tablet by mouth daily.    Marland Kitchen LORazepam (ATIVAN) 0.5 MG tablet Take 0.5 mg by mouth as needed.      No current facility-administered medications for this visit.     Medication Side Effects: None  Allergies: No Known Allergies  Past Medical History:  Diagnosis Date  . Anxiety   . Carpal tunnel syndrome   . GERD (gastroesophageal reflux disease)   . Migraine     Family History  Problem Relation Age of Onset  . Heart attack Maternal Grandfather   . Colon cancer Unknown   . Lupus Mother   . Arthritis Mother   . Hyperlipidemia Mother   . Diabetes Father   . Hyperlipidemia Father   . Hypertension Father     Social History   Socioeconomic History  . Marital status: Divorced    Spouse name: Not on file  . Number of children: Not on file  . Years of education: Not on file  . Highest education level: Not on file  Occupational History  . Occupation: ER--unit Producer, television/film/video: [employer]  Social Needs  . Financial resource strain: Not on file  . Food insecurity:    Worry: Not  on file    Inability: Not on file  . Transportation needs:    Medical: Not on file    Non-medical: Not on file  Tobacco Use  . Smoking status: Former Smoker    Packs/day: 1.50    Years: 18.00    Pack years: 27.00    Types: Cigarettes    Last attempt to quit: 09/21/2003    Years since quitting: 14.6  . Smokeless tobacco: Never Used  Substance and Sexual Activity  . Alcohol use: No  . Drug use: No  . Sexual activity: Not Currently    Partners: Male  Lifestyle  . Physical activity:    Days per week: Not on file    Minutes per session: Not on file  . Stress: Not on file  Relationships  . Social connections:    Talks on phone: Not  on file    Gets together: Not on file    Attends religious service: Not on file    Active member of club or organization: Not on file    Attends meetings of clubs or organizations: Not on file    Relationship status: Not on file  . Intimate partner violence:    Fear of current or ex partner: Not on file    Emotionally abused: Not on file    Physically abused: Not on file    Forced sexual activity: Not on file  Other Topics Concern  . Not on file  Social History Narrative   Exercise-- no    Past Medical History, Surgical history, Social history, and Family history were reviewed and updated as appropriate.   Please see review of systems for further details on the patient's review from today.   Objective:   Physical Exam:  BP (!) 180/113 (BP Location: Left Arm)   Pulse (!) 110   Physical Exam Constitutional:      General: She is not in acute distress.    Appearance: She is well-developed.  Musculoskeletal:        General: No deformity.  Neurological:     Mental Status: She is alert and oriented to person, place, and time.     Motor: No tremor.     Coordination: Coordination normal.     Gait: Gait normal.  Psychiatric:        Attention and Perception: Attention and perception normal.        Mood and Affect: Mood is not anxious or depressed. Affect is not labile, blunt, angry or inappropriate.        Speech: Speech normal.        Behavior: Behavior normal.        Thought Content: Thought content normal. Thought content does not include homicidal or suicidal ideation. Thought content does not include homicidal or suicidal plan.        Cognition and Memory: Cognition normal.        Judgment: Judgment normal.     Comments: Insight intact. No auditory or visual hallucinations. No delusions.      Lab Review:     Component Value Date/Time   NA 138 11/29/2015 0707   K 3.7 11/29/2015 0707   CL 102 11/29/2015 0707   CO2 27 11/29/2015 0707   GLUCOSE 80 11/29/2015 0707    BUN 16 11/29/2015 0707   CREATININE 0.99 11/29/2015 0707   CALCIUM 10.0 11/29/2015 0707   PROT 7.5 11/29/2015 0707   ALBUMIN 4.2 11/29/2015 0707   AST 27 11/29/2015 0707   ALT 30 11/29/2015  0707   ALKPHOS 71 11/29/2015 0707   BILITOT 0.3 11/29/2015 0707   GFRNONAA 105.17 09/30/2009 1556   GFRAA 90 06/18/2008 0940       Component Value Date/Time   WBC 8.4 11/29/2015 0707   RBC 4.78 11/29/2015 0707   HGB 13.3 11/29/2015 0707   HCT 39.3 11/29/2015 0707   PLT 273.0 11/29/2015 0707   MCV 82.3 11/29/2015 0707   MCHC 33.7 11/29/2015 0707   RDW 15.0 11/29/2015 0707   LYMPHSABS 1.8 11/29/2015 0707   MONOABS 1.0 11/29/2015 0707   EOSABS 0.1 11/29/2015 0707   BASOSABS 0.1 11/29/2015 0707    No results found for: POCLITH, LITHIUM   No results found for: PHENYTOIN, PHENOBARB, VALPROATE, CBMZ   .res Assessment: Plan:    Attention deficit hyperactivity disorder (ADHD), predominantly inattentive type  Panic disorder with agoraphobia   Satisfied with meds.  Doesn't want change.  Discussed potential benefits, risks, and side effects of stimulants with patient to include increased heart rate, palpitations, insomnia, increased anxiety, increased irritability, or decreased appetite.  Instructed patient to contact office if experiencing any significant tolerability issues.  Disc high blood pressure.  FU 6 mos  Meredith Staggers, MD, DFAPA    Please see After Visit Summary for patient specific instructions.  No future appointments.  No orders of the defined types were placed in this encounter.     -------------------------------

## 2018-05-27 LAB — HM MAMMOGRAPHY

## 2018-05-30 ENCOUNTER — Telehealth: Payer: Self-pay | Admitting: *Deleted

## 2018-05-30 NOTE — Telephone Encounter (Signed)
Received Mammogram results from [facility]; forwarded to provider/SLS 02/13  

## 2018-06-07 ENCOUNTER — Encounter: Payer: Self-pay | Admitting: Family Medicine

## 2018-08-07 ENCOUNTER — Other Ambulatory Visit: Payer: Self-pay | Admitting: Psychiatry

## 2018-08-07 DIAGNOSIS — F900 Attention-deficit hyperactivity disorder, predominantly inattentive type: Secondary | ICD-10-CM

## 2018-08-07 NOTE — Telephone Encounter (Signed)
Patient is completley out of meds so please fill asap

## 2018-08-08 NOTE — Telephone Encounter (Signed)
Alexis Zamora again requesting a refill on her Vyvanse.  Pharmacy says they don't have it yet.  Next appt 10/15/18.  Please send to [facility]

## 2018-09-09 ENCOUNTER — Other Ambulatory Visit: Payer: Self-pay | Admitting: Psychiatry

## 2018-09-09 DIAGNOSIS — F900 Attention-deficit hyperactivity disorder, predominantly inattentive type: Secondary | ICD-10-CM

## 2018-09-09 NOTE — Telephone Encounter (Signed)
appt 06/30 Last fill 04/23

## 2018-09-10 ENCOUNTER — Telehealth: Payer: Self-pay | Admitting: Psychiatry

## 2018-09-10 NOTE — Telephone Encounter (Signed)
Patient lft vm today @10 :31 need refill on Vyvanse to be sent to pharmacy on file in [facility]

## 2018-09-10 NOTE — Telephone Encounter (Signed)
Already submitted this morning, check with pharmacy

## 2018-10-15 ENCOUNTER — Ambulatory Visit (INDEPENDENT_AMBULATORY_CARE_PROVIDER_SITE_OTHER): Payer: No Typology Code available for payment source | Admitting: Psychiatry

## 2018-10-15 ENCOUNTER — Encounter: Payer: Self-pay | Admitting: Psychiatry

## 2018-10-15 ENCOUNTER — Other Ambulatory Visit: Payer: Self-pay

## 2018-10-15 VITALS — BP 125/91

## 2018-10-15 DIAGNOSIS — F4001 Agoraphobia with panic disorder: Secondary | ICD-10-CM

## 2018-10-15 DIAGNOSIS — F900 Attention-deficit hyperactivity disorder, predominantly inattentive type: Secondary | ICD-10-CM | POA: Diagnosis not present

## 2018-10-15 DIAGNOSIS — F5105 Insomnia due to other mental disorder: Secondary | ICD-10-CM

## 2018-10-15 MED ORDER — NORTRIPTYLINE HCL 25 MG PO CAPS: 125.0000 mg | ORAL_CAPSULE | Freq: Every day | ORAL | 5 refills | Status: DC

## 2018-10-15 MED ORDER — LISDEXAMFETAMINE DIMESYLATE 30 MG PO CAPS: 30.0000 mg | ORAL_CAPSULE | Freq: Every day | ORAL | 0 refills | Status: DC

## 2018-10-15 MED ORDER — LISDEXAMFETAMINE DIMESYLATE 30 MG PO CAPS: 30.0000 mg | ORAL_CAPSULE | ORAL | 0 refills | Status: DC

## 2018-10-15 MED ORDER — CLONAZEPAM 0.5 MG PO TABS: ORAL_TABLET | ORAL | 5 refills | Status: DC

## 2018-10-15 MED ORDER — LISDEXAMFETAMINE DIMESYLATE 30 MG PO CAPS: 30.0000 mg | ORAL_CAPSULE | Freq: Every morning | ORAL | 0 refills | Status: DC

## 2018-10-15 MED ORDER — LORAZEPAM 0.5 MG PO TABS: 0.5000 mg | ORAL_TABLET | ORAL | 0 refills | Status: DC | PRN

## 2018-10-15 NOTE — Progress Notes (Signed)
Alexis PoundsLorie B Zamora [id number] August 04, 1970 48 y.o.  Subjective:   Patient ID:  Alexis Zamora is a 48 y.o. (DOB August 04, 1970) female.  Chief Complaint:  Chief Complaint  Patient presents with  . Follow-up    ADD, panic  . Medication Management    HPI   Alexis Zamora presents to the office today for follow-up of panic and ADD.  Last seen January 2020.  No meds were changed.  Anxiety is up bc 48 yo D way off course.  Drugs, sex, etc. Has triggered panic.  She has cocaine addiction. Withdrew from school.  Pregnant Jan.  A lot of stress..  Hanging out with gang members.  Needs more lorazepam panic but still not often.  Starting counseling.  Sober at the moment.    A little panicky when tried to reduce clonazepam to 0.125 for a few times  In the past and hasn't done so again and so went back up.  Sleep ok with clonazepam.  OW no panic.  No sx from high blood pressure today.  She checks it often and it's ok.  Was worse before psych meds.  Had more panic when D gone for month April.  Better with her home. No avoidance.  Patient reports stable mood and denies depressed or irritable moods.   Patient denies difficulty with sleep initiation or maintenance. Denies appetite disturbance.  Patient reports that energy and motivation have been good.  Patient denies any difficulty with concentration.   Patient denies any suicidal ideation.  Vyvanse works well for attention and focus.  12 hour shifts and constant volume.  Added stress and white coat syndrome and worries over BP even when sh checks it.  Wants to try reducing the Vyvanse.  Past Psychiatric Medication Trials: Nortriptyline 125, Vyvanse 40, clonazepam at bedtime, Ritalin, duloxetine anxiety Under our care since 2010 and on nortriptyline and clonazepam since then  Review of Systems:  Review of Systems  Constitutional: Positive for unexpected weight change.  Neurological: Negative for tremors and weakness.  Psychiatric/Behavioral: Negative for  agitation, behavioral problems, confusion, decreased concentration, dysphoric mood, hallucinations, self-injury, sleep disturbance and suicidal ideas. The patient is not nervous/anxious and is not hyperactive.     Medications: I have reviewed the patient's current medications.  Current Outpatient Medications  Medication Sig Dispense Refill  . aspirin 81 MG tablet Take 81 mg by mouth daily.      . Calcium Carbonate-Vitamin D (CALCIUM 600 + D PO) Take 1 tablet by mouth daily.      . clonazePAM (KLONOPIN) 0.5 MG tablet Take 1/2 tab (0.25mg ) to 1 tab (0.5mg ) at Bedtime. 30 tablet 5  . Esomeprazole Magnesium 20 MG TBEC Take 1 tablet by mouth daily.    Alexis Zamora. [START ON 11/12/2018] lisdexamfetamine (VYVANSE) 30 MG capsule Take 1 capsule (30 mg total) by mouth every morning. 30 capsule 0  . lisdexamfetamine (VYVANSE) 30 MG capsule Take 1 capsule (30 mg total) by mouth every morning. 30 capsule 0  . LORazepam (ATIVAN) 0.5 MG tablet Take 1 tablet (0.5 mg total) by mouth as needed (for panic). 30 tablet 0  . Multiple Vitamin (MULTIVITAMIN) tablet Take 1 tablet by mouth daily.      . nortriptyline (PAMELOR) 25 MG capsule Take 5 capsules (125 mg total) by mouth at bedtime. 150 capsule 5  . polyethylene glycol powder (GLYCOLAX/MIRALAX) powder Take 17 g by mouth daily.    Marland Kitchen. VYVANSE 40 MG capsule Take 1 capsule (40 mg total) by mouth daily. 30  capsule 0  . [START ON 12/10/2018] lisdexamfetamine (VYVANSE) 30 MG capsule Take 1 capsule (30 mg total) by mouth daily. 30 capsule 0   No current facility-administered medications for this visit.     Medication Side Effects: None  Allergies: No Known Allergies  Past Medical History:  Diagnosis Date  . Anxiety   . Carpal tunnel syndrome   . GERD (gastroesophageal reflux disease)   . Migraine     Family History  Problem Relation Age of Onset  . Heart attack Maternal Grandfather   . Colon cancer Unknown   . Lupus Mother   . Arthritis Mother   . Hyperlipidemia  Mother   . Diabetes Father   . Hyperlipidemia Father   . Hypertension Father     Social History   Socioeconomic History  . Marital status: Divorced    Spouse name: Not on file  . Number of children: Not on file  . Years of education: Not on file  . Highest education level: Not on file  Occupational History  . Occupation: ER--unit Producer, television/film/videosecretary    Employer: [employer]  Social Needs  . Financial resource strain: Not on file  . Food insecurity    Worry: Not on file    Inability: Not on file  . Transportation needs    Medical: Not on file    Non-medical: Not on file  Tobacco Use  . Smoking status: Former Smoker    Packs/day: 1.50    Years: 18.00    Pack years: 27.00    Types: Cigarettes    Quit date: 09/21/2003    Years since quitting: 15.0  . Smokeless tobacco: Never Used  Substance and Sexual Activity  . Alcohol use: No  . Drug use: No  . Sexual activity: Not Currently    Partners: Male  Lifestyle  . Physical activity    Days per week: Not on file    Minutes per session: Not on file  . Stress: Not on file  Relationships  . Social Musicianconnections    Talks on phone: Not on file    Gets together: Not on file    Attends religious service: Not on file    Active member of club or organization: Not on file    Attends meetings of clubs or organizations: Not on file    Relationship status: Not on file  . Intimate partner violence    Fear of current or ex partner: Not on file    Emotionally abused: Not on file    Physically abused: Not on file    Forced sexual activity: Not on file  Other Topics Concern  . Not on file  Social History Narrative   Exercise-- no    Past Medical History, Surgical history, Social history, and Family history were reviewed and updated as appropriate.   Please see review of systems for further details on the patient's review from today.   Objective:   Physical Exam:  BP (!) 125/91   Physical Exam Constitutional:       General: She is not in acute distress.    Appearance: She is well-developed.  Musculoskeletal:        General: No deformity.  Neurological:     Mental Status: She is alert and oriented to person, place, and time.     Motor: No tremor.     Coordination: Coordination normal.     Gait: Gait normal.  Psychiatric:        Attention and Perception:  Attention and perception normal.        Mood and Affect: Mood is anxious. Mood is not depressed. Affect is tearful. Affect is not labile, blunt, angry or inappropriate.        Speech: Speech normal.        Behavior: Behavior normal.        Thought Content: Thought content normal. Thought content does not include homicidal or suicidal ideation. Thought content does not include homicidal or suicidal plan.        Cognition and Memory: Cognition normal.        Judgment: Judgment normal.     Comments: Insight intact. No auditory or visual hallucinations. No delusions.  Tearful over D's problems     Lab Review:     Component Value Date/Time   NA 138 11/29/2015 0707   K 3.7 11/29/2015 0707   CL 102 11/29/2015 0707   CO2 27 11/29/2015 0707   GLUCOSE 80 11/29/2015 0707   BUN 16 11/29/2015 0707   CREATININE 0.99 11/29/2015 0707   CALCIUM 10.0 11/29/2015 0707   PROT 7.5 11/29/2015 0707   ALBUMIN 4.2 11/29/2015 0707   AST 27 11/29/2015 0707   ALT 30 11/29/2015 0707   ALKPHOS 71 11/29/2015 0707   BILITOT 0.3 11/29/2015 0707   GFRNONAA 105.17 09/30/2009 1556   GFRAA 90 06/18/2008 0940       Component Value Date/Time   WBC 8.4 11/29/2015 0707   RBC 4.78 11/29/2015 0707   HGB 13.3 11/29/2015 0707   HCT 39.3 11/29/2015 0707   PLT 273.0 11/29/2015 0707   MCV 82.3 11/29/2015 0707   MCHC 33.7 11/29/2015 0707   RDW 15.0 11/29/2015 0707   LYMPHSABS 1.8 11/29/2015 0707   MONOABS 1.0 11/29/2015 0707   EOSABS 0.1 11/29/2015 0707   BASOSABS 0.1 11/29/2015 0707    No results found for: POCLITH, LITHIUM   No results found for: PHENYTOIN,  PHENOBARB, VALPROATE, CBMZ   .res Assessment: Plan:    Perry was seen today for follow-up and medication management.  Diagnoses and all orders for this visit:  Panic disorder with agoraphobia -     LORazepam (ATIVAN) 0.5 MG tablet; Take 1 tablet (0.5 mg total) by mouth as needed (for panic). -     nortriptyline (PAMELOR) 25 MG capsule; Take 5 capsules (125 mg total) by mouth at bedtime.  Attention deficit hyperactivity disorder (ADHD), predominantly inattentive type -     lisdexamfetamine (VYVANSE) 30 MG capsule; Take 1 capsule (30 mg total) by mouth every morning. -     lisdexamfetamine (VYVANSE) 30 MG capsule; Take 1 capsule (30 mg total) by mouth every morning. -     lisdexamfetamine (VYVANSE) 30 MG capsule; Take 1 capsule (30 mg total) by mouth daily.  Insomnia due to mental condition   Supportive tx dealing with addict D.  Answered questions about meds and options for D in detail.  Satisfied with meds except wants to reduce Vyvanse.  Reduce to 30 mg. BC of additonal stress and anxiety and worry over BP  Discussed potential benefits, risks, and side effects of stimulants with patient to include increased heart rate, palpitations, insomnia, increased anxiety, increased irritability, or decreased appetite.  Instructed patient to contact office if experiencing any significant tolerability issues.  Disc BP concerns with stimulants in detail.  No indication to change other meds.  Panic is situational and managed.  Insomnia managed with clonazepam.  Disc high blood pressure.  This appt was 30 mins.   FU  6 mos  Meredith Staggersarey Cottle, MD, DFAPA    Please see After Visit Summary for patient specific instructions.  No future appointments.  No orders of the defined types were placed in this encounter.     -------------------------------

## 2018-10-24 ENCOUNTER — Ambulatory Visit: Payer: PRIVATE HEALTH INSURANCE | Admitting: Psychiatry

## 2019-01-31 ENCOUNTER — Other Ambulatory Visit: Payer: Self-pay | Admitting: Psychiatry

## 2019-01-31 DIAGNOSIS — F900 Attention-deficit hyperactivity disorder, predominantly inattentive type: Secondary | ICD-10-CM

## 2019-02-03 NOTE — Telephone Encounter (Signed)
Next appt 12/10

## 2019-03-10 ENCOUNTER — Other Ambulatory Visit: Payer: Self-pay | Admitting: Psychiatry

## 2019-03-10 DIAGNOSIS — F900 Attention-deficit hyperactivity disorder, predominantly inattentive type: Secondary | ICD-10-CM

## 2019-03-10 NOTE — Telephone Encounter (Signed)
Apt 12/10 

## 2019-03-27 ENCOUNTER — Encounter: Payer: Self-pay | Admitting: Psychiatry

## 2019-03-27 ENCOUNTER — Other Ambulatory Visit: Payer: Self-pay

## 2019-03-27 ENCOUNTER — Ambulatory Visit (INDEPENDENT_AMBULATORY_CARE_PROVIDER_SITE_OTHER): Payer: No Typology Code available for payment source | Admitting: Psychiatry

## 2019-03-27 VITALS — BP 144/108 | HR 102

## 2019-03-27 DIAGNOSIS — F4001 Agoraphobia with panic disorder: Secondary | ICD-10-CM

## 2019-03-27 DIAGNOSIS — F5105 Insomnia due to other mental disorder: Secondary | ICD-10-CM

## 2019-03-27 DIAGNOSIS — F900 Attention-deficit hyperactivity disorder, predominantly inattentive type: Secondary | ICD-10-CM

## 2019-03-27 MED ORDER — NORTRIPTYLINE HCL 25 MG PO CAPS: 125.0000 mg | ORAL_CAPSULE | Freq: Every day | ORAL | 5 refills | Status: DC

## 2019-03-27 MED ORDER — LISDEXAMFETAMINE DIMESYLATE 30 MG PO CAPS: 30.0000 mg | ORAL_CAPSULE | Freq: Every day | ORAL | 0 refills | Status: DC

## 2019-03-27 MED ORDER — LISDEXAMFETAMINE DIMESYLATE 30 MG PO CAPS: 30.0000 mg | ORAL_CAPSULE | ORAL | 0 refills | Status: DC

## 2019-03-27 MED ORDER — CLONAZEPAM 0.5 MG PO TABS: ORAL_TABLET | ORAL | 5 refills | Status: DC

## 2019-03-27 MED ORDER — LISDEXAMFETAMINE DIMESYLATE 30 MG PO CAPS: 30.0000 mg | ORAL_CAPSULE | Freq: Every morning | ORAL | 0 refills | Status: DC

## 2019-03-27 NOTE — Progress Notes (Signed)
Alexis Zamora [id number] 08-18-1970 48 y.o.  Subjective:   Patient ID:  Alexis Zamora is a 48 y.o. (DOB 1970/10/02) female.  Chief Complaint:  Chief Complaint  Patient presents with  . Follow-up    Medication Management  . ADHD    Medication Management    HPI   Alexis Zamora presents to the office today for follow-up of panic and ADD.  Last seen June 2020.  Vyvanse was reduced at her request from 40 to 30 mg daily.  Her anxiety was higher than it had been.  Been on nortriptyline 125 for years and clonazepam 0.5 mg HS prn.  White coat syndrome with BP.  Anxiety is better.  D in IOP and getting help including meds. Working in [employer] and it's overwhelming with Covid and everyone working is tired.  Rare once monthly use lorazepam, helpful. Focus ok with reduction in Vyvanse.  Sleep is OK with clonazepam.  Usually sleeps well.  2 panic episodes since here: 1 nocturnal and 1 triggerred by bad day.  Patient reports stable mood and denies depressed or irritable moods.   Patient denies difficulty with sleep initiation or maintenance. Denies appetite disturbance.  Patient reports that energy and motivation have been good.  Patient denies any difficulty with concentration.   Patient denies any suicidal ideation.  Vyvanse works well for attention and focus.  12 hour shifts and constant volume.  Added stress and white coat syndrome and worries over BP even when sh checks it.  Wants to try reducing the Vyvanse.  Past Psychiatric Medication Trials: Nortriptyline 125, Vyvanse 40, clonazepam at bedtime, Ritalin, duloxetine anxiety Under our care since 2010 and on nortriptyline and clonazepam since then  Review of Systems:  Review of Systems  Constitutional: Positive for unexpected weight change.  Cardiovascular: Negative for palpitations.  Neurological: Negative for tremors and weakness.  Psychiatric/Behavioral: Negative for agitation, behavioral problems, confusion, decreased concentration,  dysphoric mood, hallucinations, self-injury, sleep disturbance and suicidal ideas. The patient is not nervous/anxious and is not hyperactive.     Medications: I have reviewed the patient's current medications.  Current Outpatient Medications  Medication Sig Dispense Refill  . aspirin 81 MG tablet Take 81 mg by mouth daily.      . Calcium Carbonate-Vitamin D (CALCIUM 600 + D PO) Take 1 tablet by mouth daily.      . clonazePAM (KLONOPIN) 0.5 MG tablet Take 1/2 tab (0.25mg ) to 1 tab (0.5mg ) at Bedtime. 30 tablet 5  . Esomeprazole Magnesium 20 MG TBEC Take 1 tablet by mouth daily.    Marland Kitchen lisdexamfetamine (VYVANSE) 30 MG capsule Take 1 capsule (30 mg total) by mouth every morning. 30 capsule 0  . lisdexamfetamine (VYVANSE) 30 MG capsule Take 1 capsule (30 mg total) by mouth every morning. 30 capsule 0  . LORazepam (ATIVAN) 0.5 MG tablet Take 1 tablet (0.5 mg total) by mouth as needed (for panic). 30 tablet 0  . Multiple Vitamin (MULTIVITAMIN) tablet Take 1 tablet by mouth daily.      . nortriptyline (PAMELOR) 25 MG capsule Take 5 capsules (125 mg total) by mouth at bedtime. 150 capsule 5  . polyethylene glycol powder (GLYCOLAX/MIRALAX) powder Take 17 g by mouth daily.    Marland Kitchen VYVANSE 30 MG capsule TAKE 1 CAPSULE BY MOUTH EVERY DAY 30 capsule 0   No current facility-administered medications for this visit.    Medication Side Effects: None  Allergies: No Known Allergies  Past Medical History:  Diagnosis Date  . Anxiety   .  Carpal tunnel syndrome   . GERD (gastroesophageal reflux disease)   . Migraine     Family History  Problem Relation Age of Onset  . Heart attack Maternal Grandfather   . Colon cancer Unknown   . Lupus Mother   . Arthritis Mother   . Hyperlipidemia Mother   . Diabetes Father   . Hyperlipidemia Father   . Hypertension Father     Social History   Socioeconomic History  . Marital status: Divorced    Spouse name: Not on file  . Number of children: Not on file  .  Years of education: Not on file  . Highest education level: Not on file  Occupational History  . Occupation: ER--unit Producer, television/film/video: [employer]  Tobacco Use  . Smoking status: Former Smoker    Packs/day: 1.50    Years: 18.00    Pack years: 27.00    Types: Cigarettes    Quit date: 09/21/2003    Years since quitting: 15.5  . Smokeless tobacco: Never Used  Substance and Sexual Activity  . Alcohol use: No  . Drug use: No  . Sexual activity: Not Currently    Partners: Male  Other Topics Concern  . Not on file  Social History Narrative   Exercise-- no   Social Determinants of Health   Financial Resource Strain:   . Difficulty of Paying Living Expenses: Not on file  Food Insecurity:   . Worried About Programme researcher, broadcasting/film/video in the Last Year: Not on file  . Ran Out of Food in the Last Year: Not on file  Transportation Needs:   . Lack of Transportation (Medical): Not on file  . Lack of Transportation (Non-Medical): Not on file  Physical Activity:   . Days of Exercise per Week: Not on file  . Minutes of Exercise per Session: Not on file  Stress:   . Feeling of Stress : Not on file  Social Connections:   . Frequency of Communication with Friends and Family: Not on file  . Frequency of Social Gatherings with Friends and Family: Not on file  . Attends Religious Services: Not on file  . Active Member of Clubs or Organizations: Not on file  . Attends Banker Meetings: Not on file  . Marital Status: Not on file  Intimate Partner Violence:   . Fear of Current or Ex-Partner: Not on file  . Emotionally Abused: Not on file  . Physically Abused: Not on file  . Sexually Abused: Not on file    Past Medical History, Surgical history, Social history, and Family history were reviewed and updated as appropriate.   Please see review of systems for further details on the patient's review from today.   Objective:   Physical Exam:  BP (!) 144/108   Pulse  (!) 102   Physical Exam Constitutional:      General: She is not in acute distress.    Appearance: She is well-developed.  Musculoskeletal:        General: No deformity.  Neurological:     Mental Status: She is alert and oriented to person, place, and time.     Motor: No tremor.     Coordination: Coordination normal.     Gait: Gait normal.  Psychiatric:        Attention and Perception: Attention and perception normal.        Mood and Affect: Mood is not anxious or depressed. Affect is not labile,  blunt, angry, tearful or inappropriate.        Speech: Speech normal.        Behavior: Behavior normal.        Thought Content: Thought content normal. Thought content does not include homicidal or suicidal ideation. Thought content does not include homicidal or suicidal plan.        Cognition and Memory: Cognition normal.        Judgment: Judgment normal.     Comments: Insight intact. No auditory or visual hallucinations. No delusions.       Lab Review:     Component Value Date/Time   NA 138 11/29/2015 0707   K 3.7 11/29/2015 0707   CL 102 11/29/2015 0707   CO2 27 11/29/2015 0707   GLUCOSE 80 11/29/2015 0707   BUN 16 11/29/2015 0707   CREATININE 0.99 11/29/2015 0707   CALCIUM 10.0 11/29/2015 0707   PROT 7.5 11/29/2015 0707   ALBUMIN 4.2 11/29/2015 0707   AST 27 11/29/2015 0707   ALT 30 11/29/2015 0707   ALKPHOS 71 11/29/2015 0707   BILITOT 0.3 11/29/2015 0707   GFRNONAA 105.17 09/30/2009 1556   GFRAA 90 06/18/2008 0940       Component Value Date/Time   WBC 8.4 11/29/2015 0707   RBC 4.78 11/29/2015 0707   HGB 13.3 11/29/2015 0707   HCT 39.3 11/29/2015 0707   PLT 273.0 11/29/2015 0707   MCV 82.3 11/29/2015 0707   MCHC 33.7 11/29/2015 0707   RDW 15.0 11/29/2015 0707   LYMPHSABS 1.8 11/29/2015 0707   MONOABS 1.0 11/29/2015 0707   EOSABS 0.1 11/29/2015 0707   BASOSABS 0.1 11/29/2015 0707    No results found for: POCLITH, LITHIUM   No results found for: PHENYTOIN,  PHENOBARB, VALPROATE, CBMZ   .res Assessment: Plan:    Alexis Zamora was seen today for follow-up and adhd.  Diagnoses and all orders for this visit:  Panic disorder with agoraphobia  Attention deficit hyperactivity disorder (ADHD), predominantly inattentive type  Insomnia due to mental condition   Supportive tx dealing with addict D.  Answered questions about meds and options for D in detail.  Satisfied with meds  Vyvanse 30 mg. BC of additonal stress and anxiety and worry over BP  Discussed potential benefits, risks, and side effects of stimulants with patient to include increased heart rate, palpitations, insomnia, increased anxiety, increased irritability, or decreased appetite.  Instructed patient to contact office if experiencing any significant tolerability issues.  Disc BP concerns with stimulants in detail.  Continue nortriptyline 125 mg daily which she has been on for years for panic disorder Continue clonazepam 0.25 to 0.5 mg nightly for sleep.  She has been on and able to wean off of this. Continue lorazepam 0.5 mg as needed panic this is used rarely. No med changes today  White coat BP elevation.  BP this am before coming 126/85 and pulse always in 90's before Vyvanse.  No indication to change other meds.  Panic is situational and managed.  Insomnia managed with clonazepam.  FU 6 mos  Meredith Staggersarey Cottle, MD, DFAPA    Please see After Visit Summary for patient specific instructions.  No future appointments.  No orders of the defined types were placed in this encounter.     -------------------------------

## 2019-06-03 ENCOUNTER — Other Ambulatory Visit: Payer: Self-pay

## 2019-06-03 DIAGNOSIS — F4001 Agoraphobia with panic disorder: Secondary | ICD-10-CM

## 2019-06-03 MED ORDER — NORTRIPTYLINE HCL 25 MG PO CAPS: 125.0000 mg | ORAL_CAPSULE | Freq: Every day | ORAL | 0 refills | Status: DC

## 2019-07-09 ENCOUNTER — Other Ambulatory Visit: Payer: Self-pay | Admitting: Psychiatry

## 2019-07-09 DIAGNOSIS — F900 Attention-deficit hyperactivity disorder, predominantly inattentive type: Secondary | ICD-10-CM

## 2019-07-09 MED ORDER — LISDEXAMFETAMINE DIMESYLATE 30 MG PO CAPS: 30.0000 mg | ORAL_CAPSULE | ORAL | 0 refills | Status: DC

## 2019-07-09 MED ORDER — LISDEXAMFETAMINE DIMESYLATE 30 MG PO CAPS: 30.0000 mg | ORAL_CAPSULE | Freq: Every morning | ORAL | 0 refills | Status: DC

## 2019-09-24 ENCOUNTER — Other Ambulatory Visit: Payer: Self-pay | Admitting: Psychiatry

## 2019-09-24 DIAGNOSIS — F4001 Agoraphobia with panic disorder: Secondary | ICD-10-CM

## 2019-09-25 ENCOUNTER — Other Ambulatory Visit: Payer: Self-pay

## 2019-09-25 ENCOUNTER — Encounter: Payer: Self-pay | Admitting: Psychiatry

## 2019-09-25 ENCOUNTER — Other Ambulatory Visit: Payer: Self-pay | Admitting: Psychiatry

## 2019-09-25 ENCOUNTER — Telehealth: Payer: Self-pay | Admitting: Psychiatry

## 2019-09-25 ENCOUNTER — Ambulatory Visit (INDEPENDENT_AMBULATORY_CARE_PROVIDER_SITE_OTHER): Payer: No Typology Code available for payment source | Admitting: Psychiatry

## 2019-09-25 DIAGNOSIS — F900 Attention-deficit hyperactivity disorder, predominantly inattentive type: Secondary | ICD-10-CM | POA: Diagnosis not present

## 2019-09-25 DIAGNOSIS — F4001 Agoraphobia with panic disorder: Secondary | ICD-10-CM

## 2019-09-25 DIAGNOSIS — F5105 Insomnia due to other mental disorder: Secondary | ICD-10-CM

## 2019-09-25 MED ORDER — LORAZEPAM 0.5 MG PO TABS: 0.5000 mg | ORAL_TABLET | ORAL | 1 refills | Status: DC | PRN

## 2019-09-25 MED ORDER — CLONAZEPAM 0.5 MG PO TABS: 0.7500 mg | ORAL_TABLET | Freq: Every day | ORAL | 5 refills | Status: DC

## 2019-09-25 MED ORDER — LISDEXAMFETAMINE DIMESYLATE 30 MG PO CAPS: 30.0000 mg | ORAL_CAPSULE | ORAL | 0 refills | Status: DC

## 2019-09-25 MED ORDER — LISDEXAMFETAMINE DIMESYLATE 30 MG PO CAPS: 30.0000 mg | ORAL_CAPSULE | Freq: Every day | ORAL | 0 refills | Status: DC

## 2019-09-25 MED ORDER — CLONAZEPAM 0.5 MG PO TABS: 0.7500 mg | ORAL_TABLET | Freq: Every day | ORAL | 1 refills | Status: DC

## 2019-09-25 MED ORDER — NORTRIPTYLINE HCL 25 MG PO CAPS: 125.0000 mg | ORAL_CAPSULE | Freq: Every day | ORAL | 0 refills | Status: DC

## 2019-09-25 MED ORDER — LISDEXAMFETAMINE DIMESYLATE 30 MG PO CAPS: 30.0000 mg | ORAL_CAPSULE | Freq: Every morning | ORAL | 0 refills | Status: DC

## 2019-09-25 NOTE — Telephone Encounter (Signed)
Alexis Zamora needs you to clarify the dose on the Klonopin sent in today.

## 2019-09-25 NOTE — Telephone Encounter (Signed)
Has apt this morning at 9

## 2019-09-25 NOTE — Progress Notes (Signed)
Alexis Zamora [id number] [birth year] 49 y.o.  Subjective:   Patient ID:  SU Alexis Zamora is a 49 y.o. (DOB 1970-11-01) female.  Chief Complaint:  Chief Complaint  Patient presents with  . Follow-up  . ADD  . Anxiety  . Sleeping Problem    HPI   Alexis Zamora presents to the office today for follow-up of panic and ADD.  seen June 2020.  Vyvanse was reduced at her request from 40 to 30 mg daily.  Her anxiety was higher than it had been.  Been on nortriptyline 125 for years and clonazepam 0.5 mg HS prn.  White coat syndrome with BP.  Last seen December 2020.  No meds were changed.  09/25/2019 appointment the following is noted: More often lorazepam lately twice weekly. BP is higher lately and starting meds.   Panic last week at work DT work stress. Anxiety is up worrying about BP just the last couple of weeks. Some nights can't sleep DT heart racing and worry over BP.  Rarely used lorazepam for sleep.   Chronic stress with job and older D on and off drugs and younger D using pot. Only caffeine AM.   Focus ok with reduction in Vyvanse.  Patient reports stable mood and denies depressed or irritable moods.  Denies appetite disturbance.  Patient reports that energy and motivation have been good.  Patient denies any difficulty with concentration.   Patient denies any suicidal ideation.  Vyvanse works well for attention and focus.  12 hour shifts and constant volume.  Added stress and white coat syndrome and worries over BP even when sh checks it.   Past Psychiatric Medication Trials: Nortriptyline 125, Vyvanse 40, clonazepam at bedtime, Ritalin, duloxetine anxiety Under our care since 2010 and on nortriptyline and clonazepam since then  Review of Systems:  Review of Systems  Constitutional: Positive for unexpected weight change.  Cardiovascular: Positive for palpitations. Negative for chest pain.  Neurological: Negative for tremors and weakness.  Psychiatric/Behavioral: Negative for  agitation, behavioral problems, confusion, decreased concentration, dysphoric mood, hallucinations, self-injury, sleep disturbance and suicidal ideas. The patient is not nervous/anxious and is not hyperactive.     Medications: I have reviewed the patient's current medications.  Current Outpatient Medications  Medication Sig Dispense Refill  . aspirin 81 MG tablet Take 81 mg by mouth daily.      . Calcium Carbonate-Vitamin D (CALCIUM 600 + D PO) Take 1 tablet by mouth daily.      . clonazePAM (KLONOPIN) 0.5 MG tablet Take 1.5 tablets (0.75 mg total) by mouth at bedtime. Take 1/2 tab (0.25mg ) to 1 tab (0.5mg ) at Bedtime. 45 tablet 5  . Esomeprazole Magnesium 20 MG TBEC Take 1 tablet by mouth daily.    Marland Kitchen lisdexamfetamine (VYVANSE) 30 MG capsule Take 1 capsule (30 mg total) by mouth every morning. 30 capsule 0  . [START ON 10/23/2019] lisdexamfetamine (VYVANSE) 30 MG capsule Take 1 capsule (30 mg total) by mouth every morning. 30 capsule 0  . [START ON 11/20/2019] lisdexamfetamine (VYVANSE) 30 MG capsule Take 1 capsule (30 mg total) by mouth daily. 30 capsule 0  . LORazepam (ATIVAN) 0.5 MG tablet Take 1 tablet (0.5 mg total) by mouth as needed (for panic). 30 tablet 1  . Multiple Vitamin (MULTIVITAMIN) tablet Take 1 tablet by mouth daily.      . nortriptyline (PAMELOR) 25 MG capsule Take 5 capsules (125 mg total) by mouth at bedtime. 450 capsule 0  . polyethylene glycol powder (GLYCOLAX/MIRALAX) powder  Take 17 g by mouth daily.     No current facility-administered medications for this visit.    Medication Side Effects: None  Allergies: No Known Allergies  Past Medical History:  Diagnosis Date  . Anxiety   . Carpal tunnel syndrome   . GERD (gastroesophageal reflux disease)   . Migraine     Family History  Problem Relation Age of Onset  . Heart attack Maternal Grandfather   . Colon cancer Unknown   . Lupus Mother   . Arthritis Mother   . Hyperlipidemia Mother   . Diabetes Father   .  Hyperlipidemia Father   . Hypertension Father     Social History   Socioeconomic History  . Marital status: Divorced    Spouse name: Not on file  . Number of children: Not on file  . Years of education: Not on file  . Highest education level: Not on file  Occupational History  . Occupation: ER--unit Producer, television/film/video: [employer]  Tobacco Use  . Smoking status: Former Smoker    Packs/day: 1.50    Years: 18.00    Pack years: 27.00    Types: Cigarettes    Quit date: 09/21/2003    Years since quitting: 16.0  . Smokeless tobacco: Never Used  Substance and Sexual Activity  . Alcohol use: No  . Drug use: No  . Sexual activity: Not Currently    Partners: Male  Other Topics Concern  . Not on file  Social History Narrative   Exercise-- no   Social Determinants of Health   Financial Resource Strain:   . Difficulty of Paying Living Expenses:   Food Insecurity:   . Worried About Programme researcher, broadcasting/film/video in the Last Year:   . Barista in the Last Year:   Transportation Needs:   . Freight forwarder (Medical):   Marland Kitchen Lack of Transportation (Non-Medical):   Physical Activity:   . Days of Exercise per Week:   . Minutes of Exercise per Session:   Stress:   . Feeling of Stress :   Social Connections:   . Frequency of Communication with Friends and Family:   . Frequency of Social Gatherings with Friends and Family:   . Attends Religious Services:   . Active Member of Clubs or Organizations:   . Attends Banker Meetings:   Marland Kitchen Marital Status:   Intimate Partner Violence:   . Fear of Current or Ex-Partner:   . Emotionally Abused:   Marland Kitchen Physically Abused:   . Sexually Abused:     Past Medical History, Surgical history, Social history, and Family history were reviewed and updated as appropriate.   Please see review of systems for further details on the patient's review from today.   Objective:   Physical Exam:  There were no vitals taken  for this visit.  Physical Exam Constitutional:      General: She is not in acute distress.    Appearance: She is well-developed.  Musculoskeletal:        General: No deformity.  Neurological:     Mental Status: She is alert and oriented to person, place, and time.     Motor: No tremor.     Coordination: Coordination normal.     Gait: Gait normal.  Psychiatric:        Attention and Perception: Attention and perception normal.        Mood and Affect: Mood is anxious. Mood is  not depressed. Affect is not labile, blunt, angry, tearful or inappropriate.        Speech: Speech normal. Speech is not slurred.        Behavior: Behavior normal.        Thought Content: Thought content normal. Thought content does not include homicidal or suicidal ideation. Thought content does not include homicidal or suicidal plan.        Cognition and Memory: Cognition normal.        Judgment: Judgment normal.     Comments: Insight intact. No auditory or visual hallucinations. No delusions.       Lab Review:     Component Value Date/Time   NA 138 11/29/2015 0707   K 3.7 11/29/2015 0707   CL 102 11/29/2015 0707   CO2 27 11/29/2015 0707   GLUCOSE 80 11/29/2015 0707   BUN 16 11/29/2015 0707   CREATININE 0.99 11/29/2015 0707   CALCIUM 10.0 11/29/2015 0707   PROT 7.5 11/29/2015 0707   ALBUMIN 4.2 11/29/2015 0707   AST 27 11/29/2015 0707   ALT 30 11/29/2015 0707   ALKPHOS 71 11/29/2015 0707   BILITOT 0.3 11/29/2015 0707   GFRNONAA 105.17 09/30/2009 1556   GFRAA 90 06/18/2008 0940       Component Value Date/Time   WBC 8.4 11/29/2015 0707   RBC 4.78 11/29/2015 0707   HGB 13.3 11/29/2015 0707   HCT 39.3 11/29/2015 0707   PLT 273.0 11/29/2015 0707   MCV 82.3 11/29/2015 0707   MCHC 33.7 11/29/2015 0707   RDW 15.0 11/29/2015 0707   LYMPHSABS 1.8 11/29/2015 0707   MONOABS 1.0 11/29/2015 0707   EOSABS 0.1 11/29/2015 0707   BASOSABS 0.1 11/29/2015 0707    No results found for: POCLITH, LITHIUM    No results found for: PHENYTOIN, PHENOBARB, VALPROATE, CBMZ   .res Assessment: Plan:    Allexa was seen today for follow-up, add, anxiety and sleeping problem.  Diagnoses and all orders for this visit:  Panic disorder with agoraphobia -     nortriptyline (PAMELOR) 25 MG capsule; Take 5 capsules (125 mg total) by mouth at bedtime. -     clonazePAM (KLONOPIN) 0.5 MG tablet; Take 1.5 tablets (0.75 mg total) by mouth at bedtime. Take 1/2 tab (0.25mg ) to 1 tab (0.5mg ) at Bedtime. -     LORazepam (ATIVAN) 0.5 MG tablet; Take 1 tablet (0.5 mg total) by mouth as needed (for panic).  Insomnia due to mental condition  Attention deficit hyperactivity disorder (ADHD), predominantly inattentive type -     lisdexamfetamine (VYVANSE) 30 MG capsule; Take 1 capsule (30 mg total) by mouth every morning. -     lisdexamfetamine (VYVANSE) 30 MG capsule; Take 1 capsule (30 mg total) by mouth every morning. -     lisdexamfetamine (VYVANSE) 30 MG capsule; Take 1 capsule (30 mg total) by mouth daily.   Supportive tx dealing with addict D.  Answered questions about meds and options for D in detail.   Disc medical approach to evaluating meds with risk benefit analysis.  She has concerns and anxiety over things. Satisfied with meds  Vyvanse 30 mg. BC of additonal stress and anxiety and worry over BP .  Nothing likely to improve if dose is reduced.  Discussed potential benefits, risks, and side effects of stimulants with patient to include increased heart rate, palpitations, insomnia, increased anxiety, increased irritability, or decreased appetite.  Instructed patient to contact office if experiencing any significant tolerability issues.  Disc BP concerns with  stimulants in detail.  Continue nortriptyline 125 mg daily which she has been on for years for panic disorder Continue clonazepam 0.25 to 0.5 mg nightly for sleep.  She has been on and able to wean off of this. Continue lorazepam 0.5 mg as needed panic  this is used rarely. No med changes today  Disc potential for blood pressure meds and choices.  All other things being equal a beta blocker would be a good choice bc of potential benefit for anxiety and the tendency  No indication to change other meds.  Panic is situational and usually managed until her worry about BP in last couple of weeks.. Patient has been on nortriptyline with good success for panic for many years.  It is likely once she gets her blood pressure managed then that will continue to be the case.  However we could consider switching to a more typical SSRI such as sertraline if needed.  Insomnia less managed with clonazepam.  Ok increase to 0.75 mg prn. We discussed the short-term risks associated with benzodiazepines including sedation and increased fall risk among others.  Discussed long-term side effect risk including dependence, potential withdrawal symptoms, and the potential eventual dose-related risk of dementia.  But recent studies from 2020 dispute this association between benzodiazepines and dementia risk. Newer studies in 2020 do not support an association with dementia.   FU 3 mos  Meredith Staggers, MD, DFAPA    Please see After Visit Summary for patient specific instructions.  Future Appointments  Date Time Provider Department Center  09/26/2019  3:20 PM Seabron Spates R, DO [id number]    No orders of the defined types were placed in this encounter.     -------------------------------

## 2019-09-25 NOTE — Telephone Encounter (Signed)
Corrected clonazepam to 0.75 mg HS

## 2019-09-26 ENCOUNTER — Encounter: Payer: Self-pay | Admitting: Family Medicine

## 2019-09-26 ENCOUNTER — Ambulatory Visit (INDEPENDENT_AMBULATORY_CARE_PROVIDER_SITE_OTHER): Payer: No Typology Code available for payment source | Admitting: Family Medicine

## 2019-09-26 VITALS — BP 140/108 | HR 119 | Temp 97.9°F | Resp 18 | Ht 65.0 in | Wt 234.4 lb

## 2019-09-26 DIAGNOSIS — I10 Essential (primary) hypertension: Secondary | ICD-10-CM

## 2019-09-26 MED ORDER — METOPROLOL SUCCINATE ER 50 MG PO TB24: 50.0000 mg | ORAL_TABLET | Freq: Every day | ORAL | 3 refills | Status: DC

## 2019-09-26 MED ORDER — METOPROLOL SUCCINATE ER 50 MG PO TB24: 50.0000 mg | ORAL_TABLET | Freq: Every day | ORAL | 0 refills | Status: DC

## 2019-09-26 NOTE — Patient Instructions (Signed)
DASH Eating Plan DASH stands for "Dietary Approaches to Stop Hypertension." The DASH eating plan is a healthy eating plan that has been shown to reduce high blood pressure (hypertension). It may also reduce your risk for type 2 diabetes, heart disease, and stroke. The DASH eating plan may also help with weight loss. What are tips for following this plan?  General guidelines  Avoid eating more than 2,300 mg (milligrams) of salt (sodium) a day. If you have hypertension, you may need to reduce your sodium intake to 1,500 mg a day.  Limit alcohol intake to no more than 1 drink a day for nonpregnant women and 2 drinks a day for men. One drink equals 12 oz of beer, 5 oz of wine, or 1 oz of hard liquor.  Work with your health care provider to maintain a healthy body weight or to lose weight. Ask what an ideal weight is for you.  Get at least 30 minutes of exercise that causes your heart to beat faster (aerobic exercise) most days of the week. Activities may include walking, swimming, or biking.  Work with your health care provider or diet and nutrition specialist (dietitian) to adjust your eating plan to your individual calorie needs. Reading food labels   Check food labels for the amount of sodium per serving. Choose foods with less than 5 percent of the Daily Value of sodium. Generally, foods with less than 300 mg of sodium per serving fit into this eating plan.  To find whole grains, look for the word "whole" as the first word in the ingredient list. Shopping  Buy products labeled as "low-sodium" or "no salt added."  Buy fresh foods. Avoid canned foods and premade or frozen meals. Cooking  Avoid adding salt when cooking. Use salt-free seasonings or herbs instead of table salt or sea salt. Check with your health care provider or pharmacist before using salt substitutes.  Do not fry foods. Cook foods using healthy methods such as baking, boiling, grilling, and broiling instead.  Cook with  heart-healthy oils, such as olive, canola, soybean, or sunflower oil. Meal planning  Eat a balanced diet that includes: ? 5 or more servings of fruits and vegetables each day. At each meal, try to fill half of your plate with fruits and vegetables. ? Up to 6-8 servings of whole grains each day. ? Less than 6 oz of lean meat, poultry, or fish each day. A 3-oz serving of meat is about the same size as a deck of cards. One egg equals 1 oz. ? 2 servings of low-fat dairy each day. ? A serving of nuts, seeds, or beans 5 times each week. ? Heart-healthy fats. Healthy fats called Omega-3 fatty acids are found in foods such as flaxseeds and coldwater fish, like sardines, salmon, and mackerel.  Limit how much you eat of the following: ? Canned or prepackaged foods. ? Food that is high in trans fat, such as fried foods. ? Food that is high in saturated fat, such as fatty meat. ? Sweets, desserts, sugary drinks, and other foods with added sugar. ? Full-fat dairy products.  Do not salt foods before eating.  Try to eat at least 2 vegetarian meals each week.  Eat more home-cooked food and less restaurant, buffet, and fast food.  When eating at a restaurant, ask that your food be prepared with less salt or no salt, if possible. What foods are recommended? The items listed may not be a complete list. Talk with your dietitian about   what dietary choices are best for you. Grains Whole-grain or whole-wheat bread. Whole-grain or whole-wheat pasta. Brown rice. Oatmeal. Quinoa. Bulgur. Whole-grain and low-sodium cereals. Pita bread. Low-fat, low-sodium crackers. Whole-wheat flour tortillas. Vegetables Fresh or frozen vegetables (raw, steamed, roasted, or grilled). Low-sodium or reduced-sodium tomato and vegetable juice. Low-sodium or reduced-sodium tomato sauce and tomato paste. Low-sodium or reduced-sodium canned vegetables. Fruits All fresh, dried, or frozen fruit. Canned fruit in natural juice (without  added sugar). Meat and other protein foods Skinless chicken or turkey. Ground chicken or turkey. Pork with fat trimmed off. Fish and seafood. Egg whites. Dried beans, peas, or lentils. Unsalted nuts, nut butters, and seeds. Unsalted canned beans. Lean cuts of beef with fat trimmed off. Low-sodium, lean deli meat. Dairy Low-fat (1%) or fat-free (skim) milk. Fat-free, low-fat, or reduced-fat cheeses. Nonfat, low-sodium ricotta or cottage cheese. Low-fat or nonfat yogurt. Low-fat, low-sodium cheese. Fats and oils Soft margarine without trans fats. Vegetable oil. Low-fat, reduced-fat, or light mayonnaise and salad dressings (reduced-sodium). Canola, safflower, olive, soybean, and sunflower oils. Avocado. Seasoning and other foods Herbs. Spices. Seasoning mixes without salt. Unsalted popcorn and pretzels. Fat-free sweets. What foods are not recommended? The items listed may not be a complete list. Talk with your dietitian about what dietary choices are best for you. Grains Baked goods made with fat, such as croissants, muffins, or some breads. Dry pasta or rice meal packs. Vegetables Creamed or fried vegetables. Vegetables in a cheese sauce. Regular canned vegetables (not low-sodium or reduced-sodium). Regular canned tomato sauce and paste (not low-sodium or reduced-sodium). Regular tomato and vegetable juice (not low-sodium or reduced-sodium). Pickles. Olives. Fruits Canned fruit in a light or heavy syrup. Fried fruit. Fruit in cream or butter sauce. Meat and other protein foods Fatty cuts of meat. Ribs. Fried meat. Bacon. Sausage. Bologna and other processed lunch meats. Salami. Fatback. Hotdogs. Bratwurst. Salted nuts and seeds. Canned beans with added salt. Canned or smoked fish. Whole eggs or egg yolks. Chicken or turkey with skin. Dairy Whole or 2% milk, cream, and half-and-half. Whole or full-fat cream cheese. Whole-fat or sweetened yogurt. Full-fat cheese. Nondairy creamers. Whipped toppings.  Processed cheese and cheese spreads. Fats and oils Butter. Stick margarine. Lard. Shortening. Ghee. Bacon fat. Tropical oils, such as coconut, palm kernel, or palm oil. Seasoning and other foods Salted popcorn and pretzels. Onion salt, garlic salt, seasoned salt, table salt, and sea salt. Worcestershire sauce. Tartar sauce. Barbecue sauce. Teriyaki sauce. Soy sauce, including reduced-sodium. Steak sauce. Canned and packaged gravies. Fish sauce. Oyster sauce. Cocktail sauce. Horseradish that you find on the shelf. Ketchup. Mustard. Meat flavorings and tenderizers. Bouillon cubes. Hot sauce and Tabasco sauce. Premade or packaged marinades. Premade or packaged taco seasonings. Relishes. Regular salad dressings. Where to find more information:  National Heart, Lung, and Blood Institute: www.nhlbi.nih.gov  American Heart Association: www.heart.org Summary  The DASH eating plan is a healthy eating plan that has been shown to reduce high blood pressure (hypertension). It may also reduce your risk for type 2 diabetes, heart disease, and stroke.  With the DASH eating plan, you should limit salt (sodium) intake to 2,300 mg a day. If you have hypertension, you may need to reduce your sodium intake to 1,500 mg a day.  When on the DASH eating plan, aim to eat more fresh fruits and vegetables, whole grains, lean proteins, low-fat dairy, and heart-healthy fats.  Work with your health care provider or diet and nutrition specialist (dietitian) to adjust your eating plan to your   individual calorie needs. This information is not intended to replace advice given to you by your health care provider. Make sure you discuss any questions you have with your health care provider. Document Revised: 03/16/2017 Document Reviewed: 03/27/2016 Elsevier Patient Education  2020 [employer]  

## 2019-09-26 NOTE — Progress Notes (Signed)
Patient ID: TASNIM BALENTINE, female    DOB: 1970/09/07  Age: 49 y.o. MRN: [id number]    Subjective:  Subjective  HPI Alexis Zamora presents for f/u bp.   Psych is increasing her pamelor and it is inc her heart rate and bp is in due to worsening anxiety Pt has no complaints   Review of Systems  Constitutional: Negative for appetite change, diaphoresis, fatigue and unexpected weight change.  Eyes: Negative for pain, redness and visual disturbance.  Respiratory: Negative for cough, chest tightness, shortness of breath and wheezing.   Cardiovascular: Negative for chest pain, palpitations and leg swelling.  Endocrine: Negative for cold intolerance, heat intolerance, polydipsia, polyphagia and polyuria.  Genitourinary: Negative for difficulty urinating, dysuria and frequency.  Neurological: Negative for dizziness, light-headedness, numbness and headaches.    History Past Medical History:  Diagnosis Date  . Anxiety   . Carpal tunnel syndrome   . GERD (gastroesophageal reflux disease)   . Migraine     She has a past surgical history that includes LEEP; Esophageal dilation; and Esophageal manometry.   Her family history includes Arthritis in her mother; Colon cancer in her unknown relative; Diabetes in her father; Heart attack in her maternal grandfather; Hyperlipidemia in her father and mother; Hypertension in her father; Lupus in her mother.She reports that she quit smoking about 16 years ago. Her smoking use included cigarettes. She has a 27.00 pack-year smoking history. She has never used smokeless tobacco. She reports that she does not drink alcohol and does not use drugs.  Current Outpatient Medications on File Prior to Visit  Medication Sig Dispense Refill  . aspirin 81 MG tablet Take 81 mg by mouth daily.      . Calcium Carbonate-Vitamin D (CALCIUM 600 + D PO) Take 1 tablet by mouth daily.      . clonazePAM (KLONOPIN) 0.5 MG tablet Take 1.5 tablets (0.75 mg total) by mouth at bedtime.  135 tablet 1  . Esomeprazole Magnesium 20 MG TBEC Take 1 tablet by mouth daily.    Marland Kitchen lisdexamfetamine (VYVANSE) 30 MG capsule Take 1 capsule (30 mg total) by mouth every morning. 30 capsule 0  . [START ON 10/23/2019] lisdexamfetamine (VYVANSE) 30 MG capsule Take 1 capsule (30 mg total) by mouth every morning. 30 capsule 0  . [START ON 11/20/2019] lisdexamfetamine (VYVANSE) 30 MG capsule Take 1 capsule (30 mg total) by mouth daily. 30 capsule 0  . LORazepam (ATIVAN) 0.5 MG tablet Take 1 tablet (0.5 mg total) by mouth as needed (for panic). 30 tablet 1  . Multiple Vitamin (MULTIVITAMIN) tablet Take 1 tablet by mouth daily.      . nortriptyline (PAMELOR) 25 MG capsule Take 5 capsules (125 mg total) by mouth at bedtime. 450 capsule 0  . polyethylene glycol powder (GLYCOLAX/MIRALAX) powder Take 17 g by mouth daily.     No current facility-administered medications on file prior to visit.     Objective:  Objective  Physical Exam Vitals and nursing note reviewed.  Constitutional:      Appearance: She is well-developed.  HENT:     Head: Normocephalic and atraumatic.  Eyes:     Conjunctiva/sclera: Conjunctivae normal.  Neck:     Thyroid: No thyromegaly.     Vascular: No carotid bruit or JVD.  Cardiovascular:     Rate and Rhythm: Normal rate and regular rhythm.     Heart sounds: Normal heart sounds. No murmur heard.   Pulmonary:     Effort: Pulmonary effort  is normal. No respiratory distress.     Breath sounds: Normal breath sounds. No wheezing or rales.  Chest:     Chest Riel: No tenderness.  Musculoskeletal:     Cervical back: Normal range of motion and neck supple.  Neurological:     Mental Status: She is alert and oriented to person, place, and time.    BP (!) 140/108 (BP Location: Right Arm, Patient Position: Sitting, Cuff Size: Large)   Pulse (!) 119   Temp 97.9 F (36.6 C) (Temporal)   Resp 18   Ht 5\' 5"  (1.651 m)   Wt 234 lb 6.4 oz (106.3 kg)   SpO2 98%   BMI 39.01 kg/m    Wt Readings from Last 3 Encounters:  09/26/19 234 lb 6.4 oz (106.3 kg)  11/26/15 223 lb 9.6 oz (101.4 kg)  01/10/13 210 lb 6.4 oz (95.4 kg)     Lab Results  Component Value Date   WBC 8.4 11/29/2015   HGB 13.3 11/29/2015   HCT 39.3 11/29/2015   PLT 273.0 11/29/2015   GLUCOSE 80 11/29/2015   CHOL 173 11/29/2015   TRIG 96.0 11/29/2015   HDL 65.10 11/29/2015   LDLCALC 89 11/29/2015   ALT 30 11/29/2015   AST 27 11/29/2015   NA 138 11/29/2015   K 3.7 11/29/2015   CL 102 11/29/2015   CREATININE 0.99 11/29/2015   BUN 16 11/29/2015   CO2 27 11/29/2015   TSH 2.05 12/10/2015    No results found.   Assessment & Plan:  Plan  I am having Alexis Zamora maintain her aspirin, Calcium Carbonate-Vitamin D (CALCIUM 600 + D PO), multivitamin, polyethylene glycol powder, Esomeprazole Magnesium, lisdexamfetamine, lisdexamfetamine, lisdexamfetamine, nortriptyline, LORazepam, clonazePAM, and metoprolol succinate.  Meds ordered this encounter  Medications  . DISCONTD: metoprolol succinate (TOPROL-XL) 50 MG 24 hr tablet    Sig: Take 1 tablet (50 mg total) by mouth daily. Take with or immediately following a meal.    Dispense:  90 tablet    Refill:  3  . metoprolol succinate (TOPROL-XL) 50 MG 24 hr tablet    Sig: Take 1 tablet (50 mg total) by mouth daily. Take with or immediately following a meal.    Dispense:  30 tablet    Refill:  0    Problem List Items Addressed This Visit    None    Visit Diagnoses    Essential hypertension    -  Primary   Relevant Medications   metoprolol succinate (TOPROL-XL) 50 MG 24 hr tablet   Other Relevant Orders   CBC with Differential/Platelet   Lipid panel   TSH   Comprehensive metabolic panel    Poorly controlled will alter medications, encouraged DASH diet, minimize caffeine and obtain adequate sleep. Report concerning symptoms and follow up as directed and as needed  Follow-up: Return in about 2 weeks (around 10/10/2019), or if symptoms worsen  or fail to improve, for hypertension, lipid, hep, bmp, tsh, cbcd , ua.  Ann Held, DO

## 2019-09-27 LAB — TSH: TSH: 2.77 mIU/L

## 2019-09-27 LAB — CBC WITH DIFFERENTIAL/PLATELET
Absolute Monocytes: 812 cells/uL (ref 200–950)
Basophils Absolute: 92 cells/uL (ref 0–200)
Basophils Relative: 1.4 %
Eosinophils Absolute: 99 cells/uL (ref 15–500)
Eosinophils Relative: 1.5 %
HCT: 41.3 % (ref 35.0–45.0)
Hemoglobin: 13.4 g/dL (ref 11.7–15.5)
Lymphs Abs: 1630 cells/uL (ref 850–3900)
MCH: 27.6 pg (ref 27.0–33.0)
MCHC: 32.4 g/dL (ref 32.0–36.0)
MCV: 85 fL (ref 80.0–100.0)
MPV: 10.8 fL (ref 7.5–12.5)
Monocytes Relative: 12.3 %
Neutro Abs: 3967 cells/uL (ref 1500–7800)
Neutrophils Relative %: 60.1 %
Platelets: 330 10*3/uL (ref 140–400)
RBC: 4.86 10*6/uL (ref 3.80–5.10)
RDW: 13.2 % (ref 11.0–15.0)
Total Lymphocyte: 24.7 %
WBC: 6.6 10*3/uL (ref 3.8–10.8)

## 2019-09-27 LAB — LIPID PANEL
Cholesterol: 170 mg/dL (ref <200)
HDL: 61 mg/dL (ref >=50)
LDL Cholesterol (Calc): 90 mg/dL (calc)
Non-HDL Cholesterol (Calc): 109 mg/dL (calc) (ref <130)
Total CHOL/HDL Ratio: 2.8 (calc) (ref <5.0)
Triglycerides: 92 mg/dL (ref <150)

## 2019-09-27 LAB — COMPREHENSIVE METABOLIC PANEL
AG Ratio: 1.5 (calc) (ref 1.0–2.5)
ALT: 30 U/L — ABNORMAL HIGH (ref 6–29)
AST: 27 U/L (ref 10–35)
Albumin: 4.3 g/dL (ref 3.6–5.1)
Alkaline phosphatase (APISO): 75 U/L (ref 37–153)
BUN: 9 mg/dL (ref 7–25)
CO2: 29 mmol/L (ref 20–32)
Calcium: 9.7 mg/dL (ref 8.6–10.4)
Chloride: 101 mmol/L (ref 98–110)
Creat: 0.91 mg/dL (ref 0.50–1.05)
Globulin: 2.8 g/dL (calc) (ref 1.9–3.7)
Glucose, Bld: 91 mg/dL (ref 65–99)
Potassium: 4.4 mmol/L (ref 3.5–5.3)
Sodium: 138 mmol/L (ref 135–146)
Total Bilirubin: 0.3 mg/dL (ref 0.2–1.2)
Total Protein: 7.1 g/dL (ref 6.1–8.1)

## 2019-10-10 ENCOUNTER — Other Ambulatory Visit: Payer: Self-pay

## 2019-10-10 ENCOUNTER — Telehealth: Payer: Self-pay | Admitting: Psychiatry

## 2019-10-10 DIAGNOSIS — F900 Attention-deficit hyperactivity disorder, predominantly inattentive type: Secondary | ICD-10-CM

## 2019-10-10 MED ORDER — LISDEXAMFETAMINE DIMESYLATE 30 MG PO CAPS: 30.0000 mg | ORAL_CAPSULE | Freq: Every day | ORAL | 0 refills | Status: DC

## 2019-10-10 NOTE — Telephone Encounter (Signed)
Alexis Zamora called to request her refill of Vyvanse for June only be cancelled at [facility] and be sent to [facility] on [address]  She unable to pick up the prescription while at work last and won't be back to work for several days so like it to be filled at a closer [facility].

## 2019-10-10 NOTE — Telephone Encounter (Signed)
Last refill 09/10/2019, pended 1 Rx to be sent to [facility] per request.  Patient did have 3 Rx's for Vyvanse sent to outpatient pharmacy at [facility]

## 2019-10-22 ENCOUNTER — Telehealth: Payer: Self-pay

## 2019-10-22 DIAGNOSIS — I10 Essential (primary) hypertension: Secondary | ICD-10-CM

## 2019-10-22 MED ORDER — METOPROLOL SUCCINATE ER 50 MG PO TB24: 50.0000 mg | ORAL_TABLET | Freq: Every day | ORAL | 0 refills | Status: DC

## 2019-10-22 NOTE — Telephone Encounter (Signed)
Refill sent.

## 2019-10-22 NOTE — Telephone Encounter (Signed)
Pt called and has been taking the Metoprolol and her BP has been doing great.  After one pill it came down.  She is going on vacation for three weeks and is in need of a refill.  She can be available for a virtual visit if need be while on vacation.  She tried to do a f/up with Dr. Laury Axon this week, but she is out of the office.  Pt's pharmacy is [facility].  Pt is leaving to go on vacation Friday.

## 2019-10-31 ENCOUNTER — Ambulatory Visit: Payer: No Typology Code available for payment source | Admitting: Family Medicine

## 2019-12-29 ENCOUNTER — Ambulatory Visit: Payer: No Typology Code available for payment source | Admitting: Psychiatry

## 2020-02-09 ENCOUNTER — Other Ambulatory Visit: Payer: Self-pay | Admitting: Psychiatry

## 2020-02-09 NOTE — Telephone Encounter (Signed)
Apt 02/12/20

## 2020-02-12 ENCOUNTER — Other Ambulatory Visit: Payer: Self-pay

## 2020-02-12 ENCOUNTER — Ambulatory Visit (INDEPENDENT_AMBULATORY_CARE_PROVIDER_SITE_OTHER): Payer: No Typology Code available for payment source | Admitting: Psychiatry

## 2020-02-12 ENCOUNTER — Encounter: Payer: Self-pay | Admitting: Psychiatry

## 2020-02-12 DIAGNOSIS — F4001 Agoraphobia with panic disorder: Secondary | ICD-10-CM | POA: Diagnosis not present

## 2020-02-12 DIAGNOSIS — F5105 Insomnia due to other mental disorder: Secondary | ICD-10-CM | POA: Diagnosis not present

## 2020-02-12 DIAGNOSIS — F900 Attention-deficit hyperactivity disorder, predominantly inattentive type: Secondary | ICD-10-CM | POA: Diagnosis not present

## 2020-02-12 MED ORDER — NORTRIPTYLINE HCL 25 MG PO CAPS: 125.0000 mg | ORAL_CAPSULE | Freq: Every day | ORAL | 3 refills | Status: DC

## 2020-02-12 MED ORDER — LISDEXAMFETAMINE DIMESYLATE 30 MG PO CAPS: 30.0000 mg | ORAL_CAPSULE | Freq: Every day | ORAL | 0 refills | Status: DC

## 2020-02-12 MED ORDER — CLONAZEPAM 0.5 MG PO TABS: 0.7500 mg | ORAL_TABLET | Freq: Every day | ORAL | 1 refills | Status: DC

## 2020-02-12 NOTE — Progress Notes (Signed)
SHA BURLING [id number] Mar 17, 1971 48 y.o.  Subjective:   Patient ID:  Alexis Zamora is a 49 y.o. (DOB 09/25/1970) female.  Chief Complaint:  Chief Complaint  Patient presents with  . Follow-up  . Anxiety  . Depression  . ADHD  . Sleeping Problem    HPI   Alexis Zamora presents to the office today for follow-up of panic and ADD.  seen June 2020.  Vyvanse was reduced at her request from 40 to 30 mg daily.  Her anxiety was higher than it had been.  Been on nortriptyline 125 for years and clonazepam 0.5 mg HS prn.  White coat syndrome with BP.  Last seen December 2020.  No meds were changed.  09/25/2019 appointment the following is noted: More often lorazepam lately twice weekly. BP is higher lately and starting meds.   Panic last week at work DT work stress. Anxiety is up worrying about BP just the last couple of weeks. Some nights can't sleep DT heart racing and worry over BP.  Rarely used lorazepam for sleep.   Chronic stress with job and older D on and off drugs and younger D using pot. Only caffeine AM.   Focus ok with reduction in Vyvanse.  Patient reports stable mood and denies depressed or irritable moods.  Denies appetite disturbance.  Patient reports that energy and motivation have been good.  Patient denies any difficulty with concentration.   Patient denies any suicidal ideation. Vyvanse works well for attention and focus.  12 hour shifts and constant volume.  Added stress and white coat syndrome and worries over BP even when sh checks it.  PLAN: No med changes  02/12/2020 appointment with the following noted: Rare lorazepam. Added metoprolol which helped white coat hypertension and she's pleased.  Once she knew her BP was ok then her anxiety and sleep got better.   Vyvanse working with good duration. 1 panic since here.  Not depressed. Patient reports stable mood and denies depressed or irritable moods.  Patient denies any recent difficulty with anxiety.  Patient  denies difficulty with sleep initiation or maintenance. Denies appetite disturbance.  Patient reports that energy and motivation have been good.  Patient denies any difficulty with concentration.  Patient denies any suicidal ideation. No SE concerns with meds.   Past Psychiatric Medication Trials: Nortriptyline 125, Vyvanse 40, clonazepam at bedtime, Ritalin, duloxetine anxiety Under our care since 2010 and on nortriptyline and clonazepam since then  Review of Systems:  Review of Systems  Constitutional: Positive for unexpected weight change.  Cardiovascular: Negative for chest pain and palpitations.  Neurological: Negative for tremors and weakness.  Psychiatric/Behavioral: Negative for agitation, behavioral problems, confusion, decreased concentration, dysphoric mood, hallucinations, self-injury, sleep disturbance and suicidal ideas. The patient is not nervous/anxious and is not hyperactive.     Medications: I have reviewed the patient's current medications.  Current Outpatient Medications  Medication Sig Dispense Refill  . aspirin 81 MG tablet Take 81 mg by mouth daily.      . Calcium Carbonate-Vitamin D (CALCIUM 600 + D PO) Take 1 tablet by mouth daily.      . clonazePAM (KLONOPIN) 0.5 MG tablet Take 1.5 tablets (0.75 mg total) by mouth at bedtime. 135 tablet 1  . Esomeprazole Magnesium 20 MG TBEC Take 1 tablet by mouth daily.    Marland Kitchen lisdexamfetamine (VYVANSE) 30 MG capsule Take 1 capsule (30 mg total) by mouth every morning. 30 capsule 0  . lisdexamfetamine (VYVANSE) 30 MG capsule Take  1 capsule (30 mg total) by mouth every morning. 30 capsule 0  . lisdexamfetamine (VYVANSE) 30 MG capsule Take 1 capsule (30 mg total) by mouth daily. 30 capsule 0  . lisdexamfetamine (VYVANSE) 30 MG capsule Take 1 capsule (30 mg total) by mouth daily. 90 capsule 0  . LORazepam (ATIVAN) 0.5 MG tablet Take 1 tablet (0.5 mg total) by mouth as needed (for panic). 30 tablet 1  . metoprolol succinate  (TOPROL-XL) 50 MG 24 hr tablet Take 1 tablet (50 mg total) by mouth daily. Take with or immediately following a meal. 30 tablet 0  . Multiple Vitamin (MULTIVITAMIN) tablet Take 1 tablet by mouth daily.      . nortriptyline (PAMELOR) 25 MG capsule Take 5 capsules (125 mg total) by mouth at bedtime. 450 capsule 3  . polyethylene glycol powder (GLYCOLAX/MIRALAX) powder Take 17 g by mouth daily.     No current facility-administered medications for this visit.    Medication Side Effects: None  Allergies: No Known Allergies  Past Medical History:  Diagnosis Date  . Anxiety   . Carpal tunnel syndrome   . GERD (gastroesophageal reflux disease)   . Migraine     Family History  Problem Relation Age of Onset  . Heart attack Maternal Grandfather   . Colon cancer Unknown   . Lupus Mother   . Arthritis Mother   . Hyperlipidemia Mother   . Diabetes Father   . Hyperlipidemia Father   . Hypertension Father     Social History   Socioeconomic History  . Marital status: Divorced    Spouse name: Not on file  . Number of children: Not on file  . Years of education: Not on file  . Highest education level: Not on file  Occupational History  . Occupation: ER--unit Producer, television/film/videosecretary    Employer: [employer]  Tobacco Use  . Smoking status: Former Smoker    Packs/day: 1.50    Years: 18.00    Pack years: 27.00    Types: Cigarettes    Quit date: 09/21/2003    Years since quitting: 16.4  . Smokeless tobacco: Never Used  Substance and Sexual Activity  . Alcohol use: No  . Drug use: No  . Sexual activity: Not Currently    Partners: Male  Other Topics Concern  . Not on file  Social History Narrative   Exercise-- no   Social Determinants of Health   Financial Resource Strain:   . Difficulty of Paying Living Expenses: Not on file  Food Insecurity:   . Worried About Programme researcher, broadcasting/film/videounning Out of Food in the Last Year: Not on file  . Ran Out of Food in the Last Year: Not on file   Transportation Needs:   . Lack of Transportation (Medical): Not on file  . Lack of Transportation (Non-Medical): Not on file  Physical Activity:   . Days of Exercise per Week: Not on file  . Minutes of Exercise per Session: Not on file  Stress:   . Feeling of Stress : Not on file  Social Connections:   . Frequency of Communication with Friends and Family: Not on file  . Frequency of Social Gatherings with Friends and Family: Not on file  . Attends Religious Services: Not on file  . Active Member of Clubs or Organizations: Not on file  . Attends BankerClub or Organization Meetings: Not on file  . Marital Status: Not on file  Intimate Partner Violence:   . Fear of Current or Ex-Partner:  Not on file  . Emotionally Abused: Not on file  . Physically Abused: Not on file  . Sexually Abused: Not on file    Past Medical History, Surgical history, Social history, and Family history were reviewed and updated as appropriate.   Please see review of systems for further details on the patient's review from today.   Objective:   Physical Exam:  There were no vitals taken for this visit.  Physical Exam Constitutional:      General: She is not in acute distress.    Appearance: She is well-developed.  Musculoskeletal:        General: No deformity.  Neurological:     Mental Status: She is alert and oriented to person, place, and time.     Motor: No tremor.     Coordination: Coordination normal.     Gait: Gait normal.  Psychiatric:        Attention and Perception: Attention and perception normal.        Mood and Affect: Mood is not anxious or depressed. Affect is not labile, blunt, angry, tearful or inappropriate.        Speech: Speech normal. Speech is not slurred.        Behavior: Behavior normal.        Thought Content: Thought content normal. Thought content does not include homicidal or suicidal ideation. Thought content does not include homicidal or suicidal plan.        Cognition and  Memory: Cognition normal.        Judgment: Judgment normal.     Comments: Insight intact. No auditory or visual hallucinations. No delusions.  Generally anxiety managed now that started beta blocker.     Lab Review:     Component Value Date/Time   NA 138 09/26/2019 1559   K 4.4 09/26/2019 1559   CL 101 09/26/2019 1559   CO2 29 09/26/2019 1559   GLUCOSE 91 09/26/2019 1559   BUN 9 09/26/2019 1559   CREATININE 0.91 09/26/2019 1559   CALCIUM 9.7 09/26/2019 1559   PROT 7.1 09/26/2019 1559   ALBUMIN 4.2 11/29/2015 0707   AST 27 09/26/2019 1559   ALT 30 (H) 09/26/2019 1559   ALKPHOS 71 11/29/2015 0707   BILITOT 0.3 09/26/2019 1559   GFRNONAA 105.17 09/30/2009 1556   GFRAA 90 06/18/2008 0940       Component Value Date/Time   WBC 6.6 09/26/2019 1559   RBC 4.86 09/26/2019 1559   HGB 13.4 09/26/2019 1559   HCT 41.3 09/26/2019 1559   PLT 330 09/26/2019 1559   MCV 85.0 09/26/2019 1559   MCH 27.6 09/26/2019 1559   MCHC 32.4 09/26/2019 1559   RDW 13.2 09/26/2019 1559   LYMPHSABS 1,630 09/26/2019 1559   MONOABS 1.0 11/29/2015 0707   EOSABS 99 09/26/2019 1559   BASOSABS 92 09/26/2019 1559    No results found for: POCLITH, LITHIUM   No results found for: PHENYTOIN, PHENOBARB, VALPROATE, CBMZ   .res Assessment: Plan:    Alexis Zamora was seen today for follow-up, anxiety, depression, adhd and sleeping problem.  Diagnoses and all orders for this visit:  Panic disorder with agoraphobia -     clonazePAM (KLONOPIN) 0.5 MG tablet; Take 1.5 tablets (0.75 mg total) by mouth at bedtime. -     nortriptyline (PAMELOR) 25 MG capsule; Take 5 capsules (125 mg total) by mouth at bedtime.  Attention deficit hyperactivity disorder (ADHD), predominantly inattentive type -     lisdexamfetamine (VYVANSE) 30 MG capsule; Take 1  capsule (30 mg total) by mouth daily.  Insomnia due to mental condition -     clonazePAM (KLONOPIN) 0.5 MG tablet; Take 1.5 tablets (0.75 mg total) by mouth at  bedtime.   The addition of the beta-blocker there are a lot to help her anxiety because addressing the white coat syndrome hypertension helped to reduce her anxiety significantly.  She became less worried over her heart and her health and less prone to panic.  She is only had 1 panic attack since she was here the last time.  She is very pleased with the meds overall and is tolerating meds well.  Satisfied with meds  Vyvanse 30 mg. BC of additonal stress and anxiety and worry over BP .  Nothing likely to improve if dose is reduced.  Discussed potential benefits, risks, and side effects of stimulants with patient to include increased heart rate, palpitations, insomnia, increased anxiety, increased irritability, or decreased appetite.  Instructed patient to contact office if experiencing any significant tolerability issues.  Disc BP concerns with stimulants in detail.  Continue nortriptyline 125 mg daily which she has been on for years for panic disorder Continue clonazepam 0.25 to 0.5 mg nightly for sleep.   Continue lorazepam 0.5 mg as needed panic this is used rarely. No med changes today are indicated  Disc potential for blood pressure meds and choices.  All other things being equal a beta blocker would be a good choice bc of potential benefit for anxiety and the tendency  The addition of the beta-blocker also helped her to have less anxiety around sleep and she is able to sleep with clonazepam 0.5 mg nightly  FU 6 mos  Meredith Staggers, MD, DFAPA    Please see After Visit Summary for patient specific instructions.  No future appointments.  No orders of the defined types were placed in this encounter.     -------------------------------

## 2020-02-16 LAB — HM MAMMOGRAPHY

## 2020-02-17 ENCOUNTER — Encounter: Payer: Self-pay | Admitting: *Deleted

## 2020-06-10 ENCOUNTER — Other Ambulatory Visit: Payer: Self-pay | Admitting: Psychiatry

## 2020-06-10 DIAGNOSIS — F900 Attention-deficit hyperactivity disorder, predominantly inattentive type: Secondary | ICD-10-CM

## 2020-08-03 ENCOUNTER — Encounter: Payer: Self-pay | Admitting: Family Medicine

## 2020-08-03 NOTE — Telephone Encounter (Signed)
I believe she was supposed to have a f/u a few weeks later so --- yes-- she should have f/u Ok to take apple cider vinegar

## 2020-08-10 ENCOUNTER — Ambulatory Visit: Payer: No Typology Code available for payment source | Admitting: Psychiatry

## 2020-09-01 ENCOUNTER — Other Ambulatory Visit: Payer: Self-pay | Admitting: Psychiatry

## 2020-09-01 DIAGNOSIS — F900 Attention-deficit hyperactivity disorder, predominantly inattentive type: Secondary | ICD-10-CM

## 2020-09-03 NOTE — Telephone Encounter (Signed)
Last filled 06/11/20 appt on 10/07/20

## 2020-10-07 ENCOUNTER — Ambulatory Visit: Payer: No Typology Code available for payment source | Admitting: Psychiatry

## 2020-10-07 ENCOUNTER — Other Ambulatory Visit: Payer: Self-pay

## 2020-10-07 ENCOUNTER — Encounter: Payer: Self-pay | Admitting: Psychiatry

## 2020-10-07 VITALS — BP 137/99 | HR 95

## 2020-10-07 DIAGNOSIS — F900 Attention-deficit hyperactivity disorder, predominantly inattentive type: Secondary | ICD-10-CM

## 2020-10-07 DIAGNOSIS — F4001 Agoraphobia with panic disorder: Secondary | ICD-10-CM | POA: Diagnosis not present

## 2020-10-07 DIAGNOSIS — F5105 Insomnia due to other mental disorder: Secondary | ICD-10-CM | POA: Diagnosis not present

## 2020-10-07 MED ORDER — LISDEXAMFETAMINE DIMESYLATE 30 MG PO CAPS: 30.0000 mg | ORAL_CAPSULE | ORAL | 0 refills | Status: DC

## 2020-10-07 MED ORDER — NORTRIPTYLINE HCL 50 MG PO CAPS: 100.0000 mg | ORAL_CAPSULE | Freq: Every day | ORAL | 1 refills | Status: DC

## 2020-10-07 MED ORDER — LISDEXAMFETAMINE DIMESYLATE 30 MG PO CAPS: 30.0000 mg | ORAL_CAPSULE | Freq: Every morning | ORAL | 0 refills | Status: DC

## 2020-10-07 MED ORDER — CLONAZEPAM 0.5 MG PO TABS: 0.7500 mg | ORAL_TABLET | Freq: Every day | ORAL | 1 refills | Status: DC

## 2020-10-07 MED ORDER — LISDEXAMFETAMINE DIMESYLATE 30 MG PO CAPS: 30.0000 mg | ORAL_CAPSULE | Freq: Every day | ORAL | 0 refills | Status: DC

## 2020-10-07 NOTE — Progress Notes (Signed)
Alexis Zamora [id number] December 01, 1970 50 y.o.  Subjective:   Patient ID:  Alexis Zamora is a 50 y.o. (DOB 1970-05-06) female.  Chief Complaint:  Chief Complaint  Patient presents with   Follow-up   Panic disorder with agoraphobia   ADD    HPI   Alexis Zamora presents to the office today for follow-up of panic and ADD.  seen June 2020.  Vyvanse was reduced at her request from 40 to 30 mg daily.  Her anxiety was higher than it had been.  Been on nortriptyline 125 for years and clonazepam 0.5 mg HS prn.  White coat syndrome with BP.  seen December 2020.  No meds were changed.  09/25/2019 appointment the following is noted: More often lorazepam lately twice weekly. BP is higher lately and starting meds.   Panic last week at work DT work stress. Anxiety is up worrying about BP just the last couple of weeks. Some nights can't sleep DT heart racing and worry over BP.  Rarely used lorazepam for sleep.   Chronic stress with job and older D on and off drugs and younger D using pot. Only caffeine AM.   Focus ok with reduction in Vyvanse.  Patient reports stable mood and denies depressed or irritable moods.  Denies appetite disturbance.  Patient reports that energy and motivation have been good.  Patient denies any difficulty with concentration.   Patient denies any suicidal ideation. Vyvanse works well for attention and focus.  12 hour shifts and constant volume.  Added stress and white coat syndrome and worries over BP even when sh checks it.  PLAN: No med changes  02/12/2020 appointment with the following noted: Rare lorazepam. Added metoprolol which helped white coat hypertension and she's pleased.  Once she knew her BP was ok then her anxiety and sleep got better.   Vyvanse working with good duration. 1 panic since here.  Not depressed. Patient reports stable mood and denies depressed or irritable moods.  Patient denies any recent difficulty with anxiety.  Patient denies difficulty with  sleep initiation or maintenance. Denies appetite disturbance.  Patient reports that energy and motivation have been good.  Patient denies any difficulty with concentration.  Patient denies any suicidal ideation. No SE concerns with meds. Plan:Continue nortriptyline 125 mg daily which she has been on for years for panic disorder Continue clonazepam 0.25 to 0.5 mg nightly for sleep.   Continue lorazepam 0.5 mg as needed panic this is used rarely. No med changes today are indicated Vyvanse 30  10/07/20 appt noted: Rare lorazepam. Doiing well.  Busy.  No panic since here. Patient reports stable mood and denies depressed or irritable moods.  Patient denies any recent difficulty with anxiety.  Patient denies difficulty with sleep initiation or maintenance. Denies appetite disturbance.  Patient reports that energy and motivation have been good.  Patient denies any difficulty with concentration.  Patient denies any suicidal ideation. No SE with meds. Checks BP daily and has been fine at home. Asks about reducing nortriptyline.  I want to try it.  Past Psychiatric Medication Trials: Nortriptyline 125, Vyvanse 40, clonazepam at bedtime, Ritalin, duloxetine anxiety Under our care since 2010 and on nortriptyline and clonazepam since then  Review of Systems:  Review of Systems  Constitutional:  Positive for unexpected weight change.  Cardiovascular:  Negative for chest pain and palpitations.  Neurological:  Negative for tremors and weakness.  Psychiatric/Behavioral:  Negative for agitation, behavioral problems, confusion, decreased concentration, dysphoric mood, hallucinations, self-injury,  sleep disturbance and suicidal ideas. The patient is not nervous/anxious and is not hyperactive.    Medications: I have reviewed the patient's current medications.  Current Outpatient Medications  Medication Sig Dispense Refill   aspirin 81 MG tablet Take 81 mg by mouth daily.       Calcium Carbonate-Vitamin D  (CALCIUM 600 + D PO) Take 1 tablet by mouth daily.       clonazePAM (KLONOPIN) 0.5 MG tablet Take 1.5 tablets (0.75 mg total) by mouth at bedtime. 135 tablet 1   Esomeprazole Magnesium 20 MG TBEC Take 1 tablet by mouth daily.     lisdexamfetamine (VYVANSE) 30 MG capsule Take 1 capsule (30 mg total) by mouth every morning. 30 capsule 0   lisdexamfetamine (VYVANSE) 30 MG capsule Take 1 capsule (30 mg total) by mouth every morning. 30 capsule 0   lisdexamfetamine (VYVANSE) 30 MG capsule Take 1 capsule (30 mg total) by mouth daily. 30 capsule 0   LORazepam (ATIVAN) 0.5 MG tablet Take 1 tablet (0.5 mg total) by mouth as needed (for panic). 30 tablet 1   metoprolol succinate (TOPROL-XL) 50 MG 24 hr tablet Take 1 tablet (50 mg total) by mouth daily. Take with or immediately following a meal. 30 tablet 0   Multiple Vitamin (MULTIVITAMIN) tablet Take 1 tablet by mouth daily.       nortriptyline (PAMELOR) 25 MG capsule Take 5 capsules (125 mg total) by mouth at bedtime. 450 capsule 3   polyethylene glycol powder (GLYCOLAX/MIRALAX) powder Take 17 g by mouth daily.     VYVANSE 30 MG capsule Take 1 capsule (30 mg total) by mouth daily. 90 capsule 0   No current facility-administered medications for this visit.    Medication Side Effects: None  Allergies: No Known Allergies  Past Medical History:  Diagnosis Date   Anxiety    Carpal tunnel syndrome    GERD (gastroesophageal reflux disease)    Migraine     Family History  Problem Relation Age of Onset   Heart attack Maternal Grandfather    Colon cancer Other    Lupus Mother    Arthritis Mother    Hyperlipidemia Mother    Diabetes Father    Hyperlipidemia Father    Hypertension Father     Social History   Socioeconomic History   Marital status: Divorced    Spouse name: Not on file   Number of children: Not on file   Years of education: Not on file   Highest education level: Not on file  Occupational History   Occupation: ER--unit  Producer, television/film/video: [employer]  Tobacco Use   Smoking status: Former    Packs/day: 1.50    Years: 18.00    Pack years: 27.00    Types: Cigarettes    Quit date: 09/21/2003    Years since quitting: 17.0   Smokeless tobacco: Never  Substance and Sexual Activity   Alcohol use: No   Drug use: No   Sexual activity: Not Currently    Partners: Male  Other Topics Concern   Not on file  Social History Narrative   Exercise-- no   Social Determinants of Health   Financial Resource Strain: Not on file  Food Insecurity: Not on file  Transportation Needs: Not on file  Physical Activity: Not on file  Stress: Not on file  Social Connections: Not on file  Intimate Partner Violence: Not on file    Past Medical History, Surgical history, Social  history, and Family history were reviewed and updated as appropriate.   Please see review of systems for further details on the patient's review from today.   Objective:   Physical Exam:  BP (!) 137/99   Pulse 95   Physical Exam Constitutional:      General: She is not in acute distress. Musculoskeletal:        General: No deformity.  Neurological:     Mental Status: She is alert and oriented to person, place, and time.     Coordination: Coordination normal.     Gait: Gait normal.  Psychiatric:        Attention and Perception: Attention and perception normal.        Mood and Affect: Mood is not anxious or depressed. Affect is not labile, blunt, angry, tearful or inappropriate.        Speech: Speech normal. Speech is not slurred.        Behavior: Behavior normal.        Thought Content: Thought content normal. Thought content does not include homicidal or suicidal ideation. Thought content does not include homicidal or suicidal plan.        Cognition and Memory: Cognition normal.        Judgment: Judgment normal.     Comments: Insight intact. No auditory or visual hallucinations. No delusions.  Generally anxiety  managed now that started beta blocker.    Lab Review:     Component Value Date/Time   NA 138 09/26/2019 1559   K 4.4 09/26/2019 1559   CL 101 09/26/2019 1559   CO2 29 09/26/2019 1559   GLUCOSE 91 09/26/2019 1559   BUN 9 09/26/2019 1559   CREATININE 0.91 09/26/2019 1559   CALCIUM 9.7 09/26/2019 1559   PROT 7.1 09/26/2019 1559   ALBUMIN 4.2 11/29/2015 0707   AST 27 09/26/2019 1559   ALT 30 (H) 09/26/2019 1559   ALKPHOS 71 11/29/2015 0707   BILITOT 0.3 09/26/2019 1559   GFRNONAA 105.17 09/30/2009 1556   GFRAA 90 06/18/2008 0940       Component Value Date/Time   WBC 6.6 09/26/2019 1559   RBC 4.86 09/26/2019 1559   HGB 13.4 09/26/2019 1559   HCT 41.3 09/26/2019 1559   PLT 330 09/26/2019 1559   MCV 85.0 09/26/2019 1559   MCH 27.6 09/26/2019 1559   MCHC 32.4 09/26/2019 1559   RDW 13.2 09/26/2019 1559   LYMPHSABS 1,630 09/26/2019 1559   MONOABS 1.0 11/29/2015 0707   EOSABS 99 09/26/2019 1559   BASOSABS 92 09/26/2019 1559    No results found for: POCLITH, LITHIUM   No results found for: PHENYTOIN, PHENOBARB, VALPROATE, CBMZ   .res Assessment: Plan:    Alexis Zamora was seen today for follow-up, panic disorder with agoraphobia and add.  Diagnoses and all orders for this visit:  Panic disorder with agoraphobia  Attention deficit hyperactivity disorder (ADHD), predominantly inattentive type  Insomnia due to mental condition  Greater than 50% of 30 min face to face time with patient was spent on counseling and coordination of care. We discussed the following:  The addition of the beta-blocker there are a lot to help her anxiety because addressing the white coat syndrome hypertension helped to reduce her anxiety significantly.  She became less worried over her heart and her health and less prone to panic.  She is only had 1 panic attack since she was here the last time.  She is very pleased with the meds overall and is tolerating  meds well.  OK trial reduction in nortriptyline  to 100 mg HS to get to LED for panic. Discussed the risk of reducing nortriptyline to the level it no longer prevents panic.  However her anxiety is better since the beta-blocker was added in she may be able to get by with less nortriptyline.  Satisfied with meds  Vyvanse 30 mg. BC of additonal stress and anxiety and worry over BP .  Nothing likely to improve if dose is reduced.  Wonders about increase but doesn't want to do so.  Using discount card.  Discussed potential benefits, risks, and side effects of stimulants with patient to include increased heart rate, palpitations, insomnia, increased anxiety, increased irritability, or decreased appetite.  Instructed patient to contact office if experiencing any significant tolerability issues.  Disc BP concerns with stimulants in detail.  Disc BP issues and her's bein up  at MD offices anf fine at home.  Continue nortriptyline 125 mg daily which she has been on for years for panic disorder Continue clonazepam 0.25 to 0.5 mg nightly for sleep.   Continue lorazepam 0.5 mg as needed panic this is used rarely. No med changes today are indicated  Disc potential for blood pressure meds and choices.  All other things being equal a beta blocker would be a good choice bc of potential benefit for anxiety and the tendency  The addition of the beta-blocker also helped her to have less anxiety around sleep and she is able to sleep with clonazepam 0.5 mg nightly  FU 6 mos  Meredith Staggers, MD, DFAPA    Please see After Visit Summary for patient specific instructions.  No future appointments.  No orders of the defined types were placed in this encounter.     -------------------------------

## 2020-10-18 ENCOUNTER — Other Ambulatory Visit: Payer: Self-pay | Admitting: Family Medicine

## 2020-10-18 DIAGNOSIS — I10 Essential (primary) hypertension: Secondary | ICD-10-CM

## 2020-10-19 MED ORDER — METOPROLOL SUCCINATE ER 50 MG PO TB24: 50.0000 mg | ORAL_TABLET | Freq: Every day | ORAL | 0 refills | Status: DC

## 2020-10-22 ENCOUNTER — Encounter: Payer: Self-pay | Admitting: Family Medicine

## 2020-10-22 ENCOUNTER — Ambulatory Visit (INDEPENDENT_AMBULATORY_CARE_PROVIDER_SITE_OTHER): Payer: No Typology Code available for payment source | Admitting: Family Medicine

## 2020-10-22 ENCOUNTER — Other Ambulatory Visit: Payer: Self-pay

## 2020-10-22 VITALS — BP 110/84 | HR 104 | Temp 98.5°F | Resp 18 | Ht 65.0 in | Wt 242.8 lb

## 2020-10-22 DIAGNOSIS — I10 Essential (primary) hypertension: Secondary | ICD-10-CM

## 2020-10-22 DIAGNOSIS — Z1159 Encounter for screening for other viral diseases: Secondary | ICD-10-CM

## 2020-10-22 LAB — CBC WITH DIFFERENTIAL/PLATELET
Basophils Absolute: 0.1 10*3/uL (ref 0.0–0.1)
Basophils Relative: 1.2 % (ref 0.0–3.0)
Eosinophils Absolute: 0.1 10*3/uL (ref 0.0–0.7)
Eosinophils Relative: 1.6 % (ref 0.0–5.0)
HCT: 40.2 % (ref 36.0–46.0)
Hemoglobin: 13.2 g/dL (ref 12.0–15.0)
Lymphocytes Relative: 22.5 % (ref 12.0–46.0)
Lymphs Abs: 1.5 10*3/uL (ref 0.7–4.0)
MCHC: 32.9 g/dL (ref 30.0–36.0)
MCV: 86.6 fl (ref 78.0–100.0)
Monocytes Absolute: 0.7 10*3/uL (ref 0.1–1.0)
Monocytes Relative: 10.5 % (ref 3.0–12.0)
Neutro Abs: 4.2 10*3/uL (ref 1.4–7.7)
Neutrophils Relative %: 64.2 % (ref 43.0–77.0)
Platelets: 277 10*3/uL (ref 150.0–400.0)
RBC: 4.64 Mil/uL (ref 3.87–5.11)
RDW: 14.8 % (ref 11.5–15.5)
WBC: 6.6 10*3/uL (ref 4.0–10.5)

## 2020-10-22 LAB — COMPREHENSIVE METABOLIC PANEL
ALT: 39 U/L — ABNORMAL HIGH (ref 0–35)
AST: 37 U/L (ref 0–37)
Albumin: 4.3 g/dL (ref 3.5–5.2)
Alkaline Phosphatase: 57 U/L (ref 39–117)
BUN: 13 mg/dL (ref 6–23)
CO2: 30 mEq/L (ref 19–32)
Calcium: 9.6 mg/dL (ref 8.4–10.5)
Chloride: 98 mEq/L (ref 96–112)
Creatinine, Ser: 0.93 mg/dL (ref 0.40–1.20)
GFR: 71.15 mL/min (ref >60.00)
Glucose, Bld: 91 mg/dL (ref 70–99)
Potassium: 4.4 mEq/L (ref 3.5–5.1)
Sodium: 136 mEq/L (ref 135–145)
Total Bilirubin: 0.7 mg/dL (ref 0.2–1.2)
Total Protein: 7.1 g/dL (ref 6.0–8.3)

## 2020-10-22 LAB — LIPID PANEL
Cholesterol: 167 mg/dL (ref 0–200)
HDL: 60.1 mg/dL (ref >39.00)
LDL Cholesterol: 91 mg/dL (ref 0–99)
NonHDL: 106.77
Total CHOL/HDL Ratio: 3
Triglycerides: 81 mg/dL (ref 0.0–149.0)
VLDL: 16.2 mg/dL (ref 0.0–40.0)

## 2020-10-22 LAB — TSH: TSH: 4.25 u[IU]/mL (ref 0.35–5.50)

## 2020-10-22 MED ORDER — METOPROLOL SUCCINATE ER 50 MG PO TB24: 50.0000 mg | ORAL_TABLET | Freq: Every day | ORAL | 1 refills | Status: DC

## 2020-10-22 NOTE — Progress Notes (Signed)
Patient ID: Alexis Zamora, female    DOB: [birth year]  Age: 50 y.o. MRN: [id number]    Subjective:  Subjective  HPI Alexis Zamora presents for an office visit today. She complains of R knee pain and swelling. She states that she has not been exercising regularly due to her torn R meniscus. She reports using compression, however she denies using any voltaren gel. Pt is seeing ortho.   She endorses taking 50 mg TOPROL-XL PO Daily for her dx of HTN.  She notes that this morning and yesterday her BP was high due to anxiety of today's visit.  She states that her at home BP is 131/79-84.  BP Readings from Last 3 Encounters:  10/22/20 110/84  09/26/19 (!) 140/108  11/26/15 (!) 142/100  She reports that she has been trying to reduce her intake of 50 Pamelor BID daily for her dx of panic disorder. She notes that she has rarely taken any 0.5 mg lorazepam PO as needed for panic disorder.  She denies any chest pain, SOB, fever, abdominal pain, cough, chills, sore throat, dysuria, urinary incontinence, back pain, HA, or N/V/D at this time. She notes that she has been reducing her sodium intake, however she has been consuming liquid IV, after performing yard work.   Review of Systems  Constitutional:  Negative for chills, fatigue and fever.  HENT:  Negative for ear pain, rhinorrhea, sinus pressure, sinus pain, sore throat and tinnitus.   Eyes:  Negative for pain.  Respiratory:  Negative for cough, shortness of breath and wheezing.   Cardiovascular:  Negative for chest pain.  Gastrointestinal:  Negative for abdominal pain, anal bleeding, constipation, diarrhea, nausea and vomiting.  Genitourinary:  Negative for flank pain.  Musculoskeletal:  Positive for joint swelling (R knee, chronic) and myalgias (R knee, chronic). Negative for back pain and neck pain.  Skin:  Negative for rash.  Neurological:  Negative for seizures, weakness, light-headedness, numbness and headaches.   History Past Medical History:   Diagnosis Date   Anxiety    Carpal tunnel syndrome    GERD (gastroesophageal reflux disease)    Migraine     She has a past surgical history that includes LEEP; Esophageal dilation; and Esophageal manometry.   Her family history includes Arthritis in her mother; Colon cancer in an other family member; Diabetes in her father; Heart attack in her maternal grandfather; Hyperlipidemia in her father and mother; Hypertension in her father; Lupus in her mother.She reports that she quit smoking about 17 years ago. Her smoking use included cigarettes. She has a 27.00 pack-year smoking history. She has never used smokeless tobacco. She reports that she does not drink alcohol and does not use drugs.  Current Outpatient Medications on File Prior to Visit  Medication Sig Dispense Refill   aspirin 81 MG tablet Take 81 mg by mouth daily.       Calcium Carbonate-Vitamin D (CALCIUM 600 + D PO) Take 1 tablet by mouth daily.       clonazePAM (KLONOPIN) 0.5 MG tablet Take 1.5 tablets (0.75 mg total) by mouth at bedtime. 135 tablet 1   Esomeprazole Magnesium 20 MG TBEC Take 1 tablet by mouth daily.     lisdexamfetamine (VYVANSE) 30 MG capsule Take 1 capsule (30 mg total) by mouth every morning. 30 capsule 0   [START ON 11/04/2020] lisdexamfetamine (VYVANSE) 30 MG capsule Take 1 capsule (30 mg total) by mouth every morning. 30 capsule 0   [START ON 12/02/2020] lisdexamfetamine (VYVANSE) 30  MG capsule Take 1 capsule (30 mg total) by mouth daily. 30 capsule 0   LORazepam (ATIVAN) 0.5 MG tablet Take 1 tablet (0.5 mg total) by mouth as needed (for panic). 30 tablet 1   Multiple Vitamin (MULTIVITAMIN) tablet Take 1 tablet by mouth daily.       nortriptyline (PAMELOR) 50 MG capsule Take 2 capsules (100 mg total) by mouth at bedtime. 180 capsule 1   polyethylene glycol powder (GLYCOLAX/MIRALAX) powder Take 17 g by mouth daily.     VYVANSE 30 MG capsule Take 1 capsule (30 mg total) by mouth daily. 90 capsule 0   No  current facility-administered medications on file prior to visit.     Objective:  Objective  Physical Exam Vitals and nursing note reviewed.  Constitutional:      General: She is not in acute distress.    Appearance: Normal appearance. She is well-developed. She is not ill-appearing.  HENT:     Head: Normocephalic and atraumatic.     Right Ear: External ear normal.     Left Ear: External ear normal.     Nose: Nose normal.  Eyes:     General:        Right eye: No discharge.        Left eye: No discharge.     Extraocular Movements: Extraocular movements intact.     Pupils: Pupils are equal, round, and reactive to light.  Cardiovascular:     Rate and Rhythm: Normal rate and regular rhythm.     Pulses: Normal pulses.     Heart sounds: Normal heart sounds. No murmur heard.   No friction rub. No gallop.  Pulmonary:     Effort: Pulmonary effort is normal. No respiratory distress.     Breath sounds: Normal breath sounds. No stridor. No wheezing, rhonchi or rales.  Chest:     Chest Bua: No tenderness.  Abdominal:     General: Bowel sounds are normal. There is no distension.     Palpations: Abdomen is soft. There is no mass.     Tenderness: There is no abdominal tenderness. There is no guarding or rebound.     Hernia: No hernia is present.  Musculoskeletal:        General: Normal range of motion.     Cervical back: Normal range of motion and neck supple.     Right lower leg: No edema.     Left lower leg: No edema.  Skin:    General: Skin is warm and dry.  Neurological:     Mental Status: She is alert and oriented to person, place, and time.  Psychiatric:        Behavior: Behavior normal.        Thought Content: Thought content normal.   BP 110/84 (BP Location: Right Arm, Patient Position: Sitting, Cuff Size: Large)   Pulse (!) 104   Temp 98.5 F (36.9 C) (Oral)   Resp 18   Ht 5\' 5"  (1.651 m)   Wt 242 lb 12.8 oz (110.1 kg)   SpO2 99%   BMI 40.40 kg/m  Wt Readings from  Last 3 Encounters:  10/22/20 242 lb 12.8 oz (110.1 kg)  09/26/19 234 lb 6.4 oz (106.3 kg)  11/26/15 223 lb 9.6 oz (101.4 kg)     Lab Results  Component Value Date   WBC 6.6 09/26/2019   HGB 13.4 09/26/2019   HCT 41.3 09/26/2019   PLT 330 09/26/2019   GLUCOSE 91 09/26/2019  CHOL 170 09/26/2019   TRIG 92 09/26/2019   HDL 61 09/26/2019   LDLCALC 90 09/26/2019   ALT 30 (H) 09/26/2019   AST 27 09/26/2019   NA 138 09/26/2019   K 4.4 09/26/2019   CL 101 09/26/2019   CREATININE 0.91 09/26/2019   BUN 9 09/26/2019   CO2 29 09/26/2019   TSH 2.77 09/26/2019    No results found.   Assessment & Plan:  Plan    Meds ordered this encounter  Medications   metoprolol succinate (TOPROL-XL) 50 MG 24 hr tablet    Sig: Take 1 tablet (50 mg total) by mouth daily. Take with or immediately following a meal.    Dispense:  90 tablet    Refill:  1    Problem List Items Addressed This Visit       Unprioritized   Essential hypertension - Primary    Well controlled, no changes to meds. Encouraged heart healthy diet such as the DASH diet and exercise as tolerated.  con't metoprolol  rto for cpe       Relevant Medications   metoprolol succinate (TOPROL-XL) 50 MG 24 hr tablet   Other Relevant Orders   Lipid panel   CBC with Differential/Platelet   TSH   Comprehensive metabolic panel   Other Visit Diagnoses     Need for hepatitis C screening test       Relevant Orders   Hepatitis C antibody       Follow-up: Return in about 3 months (around 01/22/2021), or if symptoms worsen or fail to improve, for annual exam, fasting.   I,Gordon Zheng,acting as a Neurosurgeon for [employer], DO.,have documented all relevant documentation on the behalf of Donato Schultz, DO,as directed by  Donato Schultz, DO while in the presence of Donato Schultz, DO.  I, Donato Schultz, DO, have reviewed all documentation for this visit. The documentation on 10/22/20 for the exam,  diagnosis, procedures, and orders are all accurate and complete.

## 2020-10-22 NOTE — Patient Instructions (Signed)
https://www.nhlbi.nih.gov/files/docs/public/heart/dash_brief.pdf">  DASH Eating Plan DASH stands for Dietary Approaches to Stop Hypertension. The DASH eating plan is a healthy eating plan that has been shown to: Reduce high blood pressure (hypertension). Reduce your risk for type 2 diabetes, heart disease, and stroke. Help with weight loss. What are tips for following this plan? Reading food labels Check food labels for the amount of salt (sodium) per serving. Choose foods with less than 5 percent of the Daily Value of sodium. Generally, foods with less than 300 milligrams (mg) of sodium per serving fit into this eating plan. To find whole grains, look for the word "whole" as the first word in the ingredient list. Shopping Buy products labeled as "low-sodium" or "no salt added." Buy fresh foods. Avoid canned foods and pre-made or frozen meals. Cooking Avoid adding salt when cooking. Use salt-free seasonings or herbs instead of table salt or sea salt. Check with your health care provider or pharmacist before using salt substitutes. Do not fry foods. Cook foods using healthy methods such as baking, boiling, grilling, roasting, and broiling instead. Cook with heart-healthy oils, such as olive, canola, avocado, soybean, or sunflower oil. Meal planning  Eat a balanced diet that includes: 4 or more servings of fruits and 4 or more servings of vegetables each day. Try to fill one-half of your plate with fruits and vegetables. 6-8 servings of whole grains each day. Less than 6 oz (170 g) of lean meat, poultry, or fish each day. A 3-oz (85-g) serving of meat is about the same size as a deck of cards. One egg equals 1 oz (28 g). 2-3 servings of low-fat dairy each day. One serving is 1 cup (237 mL). 1 serving of nuts, seeds, or beans 5 times each week. 2-3 servings of heart-healthy fats. Healthy fats called omega-3 fatty acids are found in foods such as walnuts, flaxseeds, fortified milks, and eggs.  These fats are also found in cold-water fish, such as sardines, salmon, and mackerel. Limit how much you eat of: Canned or prepackaged foods. Food that is high in trans fat, such as some fried foods. Food that is high in saturated fat, such as fatty meat. Desserts and other sweets, sugary drinks, and other foods with added sugar. Full-fat dairy products. Do not salt foods before eating. Do not eat more than 4 egg yolks a week. Try to eat at least 2 vegetarian meals a week. Eat more home-cooked food and less restaurant, buffet, and fast food.  Lifestyle When eating at a restaurant, ask that your food be prepared with less salt or no salt, if possible. If you drink alcohol: Limit how much you use to: 0-1 drink a day for women who are not pregnant. 0-2 drinks a day for men. Be aware of how much alcohol is in your drink. In the [place] one drink equals one 12 oz bottle of beer (355 mL), one 5 oz glass of wine (148 mL), or one 1 oz glass of hard liquor (44 mL). General information Avoid eating more than 2,300 mg of salt a day. If you have hypertension, you may need to reduce your sodium intake to 1,500 mg a day. Work with your health care provider to maintain a healthy body weight or to lose weight. Ask what an ideal weight is for you. Get at least 30 minutes of exercise that causes your heart to beat faster (aerobic exercise) most days of the week. Activities may include walking, swimming, or biking. Work with your health care provider   or dietitian to adjust your eating plan to your individual calorie needs. What foods should I eat? Fruits All fresh, dried, or frozen fruit. Canned fruit in natural juice (without addedsugar). Vegetables Fresh or frozen vegetables (raw, steamed, roasted, or grilled). Low-sodium or reduced-sodium tomato and vegetable juice. Low-sodium or reduced-sodium tomatosauce and tomato paste. Low-sodium or reduced-sodium canned vegetables. Grains Whole-grain or  whole-wheat bread. Whole-grain or whole-wheat pasta. Brown rice. Oatmeal. Quinoa. Bulgur. Whole-grain and low-sodium cereals. Pita bread.Low-fat, low-sodium crackers. Whole-wheat flour tortillas. Meats and other proteins Skinless chicken or turkey. Ground chicken or turkey. Pork with fat trimmed off. Fish and seafood. Egg whites. Dried beans, peas, or lentils. Unsalted nuts, nut butters, and seeds. Unsalted canned beans. Lean cuts of beef with fat trimmed off. Low-sodium, lean precooked or cured meat, such as sausages or meatloaves. Dairy Low-fat (1%) or fat-free (skim) milk. Reduced-fat, low-fat, or fat-free cheeses. Nonfat, low-sodium ricotta or cottage cheese. Low-fat or nonfatyogurt. Low-fat, low-sodium cheese. Fats and oils Soft margarine without trans fats. Vegetable oil. Reduced-fat, low-fat, or light mayonnaise and salad dressings (reduced-sodium). Canola, safflower, olive, avocado, soybean, andsunflower oils. Avocado. Seasonings and condiments Herbs. Spices. Seasoning mixes without salt. Other foods Unsalted popcorn and pretzels. Fat-free sweets. The items listed above may not be a complete list of foods and beverages you can eat. Contact a dietitian for more information. What foods should I avoid? Fruits Canned fruit in a light or heavy syrup. Fried fruit. Fruit in cream or buttersauce. Vegetables Creamed or fried vegetables. Vegetables in a cheese sauce. Regular canned vegetables (not low-sodium or reduced-sodium). Regular canned tomato sauce and paste (not low-sodium or reduced-sodium). Regular tomato and vegetable juice(not low-sodium or reduced-sodium). Pickles. Olives. Grains Baked goods made with fat, such as croissants, muffins, or some breads. Drypasta or rice meal packs. Meats and other proteins Fatty cuts of meat. Ribs. Fried meat. Bacon. Bologna, salami, and other precooked or cured meats, such as sausages or meat loaves. Fat from the back of a pig (fatback). Bratwurst.  Salted nuts and seeds. Canned beans with added salt. Canned orsmoked fish. Whole eggs or egg yolks. Chicken or turkey with skin. Dairy Whole or 2% milk, cream, and half-and-half. Whole or full-fat cream cheese. Whole-fat or sweetened yogurt. Full-fat cheese. Nondairy creamers. Whippedtoppings. Processed cheese and cheese spreads. Fats and oils Butter. Stick margarine. Lard. Shortening. Ghee. Bacon fat. Tropical oils, suchas coconut, palm kernel, or palm oil. Seasonings and condiments Onion salt, garlic salt, seasoned salt, table salt, and sea salt. Worcestershire sauce. Tartar sauce. Barbecue sauce. Teriyaki sauce. Soy sauce, including reduced-sodium. Steak sauce. Canned and packaged gravies. Fish sauce. Oyster sauce. Cocktail sauce. Store-bought horseradish. Ketchup. Mustard. Meat flavorings and tenderizers. Bouillon cubes. Hot sauces. Pre-made or packaged marinades. Pre-made or packaged taco seasonings. Relishes. Regular saladdressings. Other foods Salted popcorn and pretzels. The items listed above may not be a complete list of foods and beverages you should avoid. Contact a dietitian for more information. Where to find more information National Heart, Lung, and Blood Institute: www.nhlbi.nih.gov American Heart Association: www.heart.org Academy of Nutrition and Dietetics: www.eatright.org National Kidney Foundation: www.kidney.org Summary The DASH eating plan is a healthy eating plan that has been shown to reduce high blood pressure (hypertension). It may also reduce your risk for type 2 diabetes, heart disease, and stroke. When on the DASH eating plan, aim to eat more fresh fruits and vegetables, whole grains, lean proteins, low-fat dairy, and heart-healthy fats. With the DASH eating plan, you should limit salt (sodium) intake to 2,300   mg a day. If you have hypertension, you may need to reduce your sodium intake to 1,500 mg a day. Work with your health care provider or dietitian to adjust  your eating plan to your individual calorie needs. This information is not intended to replace advice given to you by your health care provider. Make sure you discuss any questions you have with your healthcare provider. Document Revised: 03/07/2019 Document Reviewed: 03/07/2019 Elsevier Patient Education  2022 Elsevier Inc.  

## 2020-10-22 NOTE — Assessment & Plan Note (Signed)
Well controlled, no changes to meds. Encouraged heart healthy diet such as the DASH diet and exercise as tolerated.  con't metoprolol  rto for cpe

## 2020-10-25 LAB — HEPATITIS C ANTIBODY
Hepatitis C Ab: NONREACTIVE
SIGNAL TO CUT-OFF: 0.01 (ref <1.00)

## 2020-11-01 ENCOUNTER — Encounter: Payer: Self-pay | Admitting: Family Medicine

## 2020-11-01 MED ORDER — CEPHALEXIN 500 MG PO CAPS: 500.0000 mg | ORAL_CAPSULE | Freq: Two times a day (BID) | ORAL | 0 refills | Status: DC

## 2020-11-01 NOTE — Telephone Encounter (Signed)
Patient denied appt hoping Dr.Lowne would prescribe something.

## 2020-11-29 ENCOUNTER — Other Ambulatory Visit: Payer: Self-pay | Admitting: Psychiatry

## 2020-11-29 DIAGNOSIS — F900 Attention-deficit hyperactivity disorder, predominantly inattentive type: Secondary | ICD-10-CM

## 2021-01-27 ENCOUNTER — Ambulatory Visit: Payer: No Typology Code available for payment source | Admitting: Family Medicine

## 2021-03-25 LAB — HM MAMMOGRAPHY

## 2021-04-04 ENCOUNTER — Ambulatory Visit: Payer: No Typology Code available for payment source | Admitting: Psychiatry

## 2021-05-16 ENCOUNTER — Other Ambulatory Visit: Payer: Self-pay | Admitting: Psychiatry

## 2021-05-16 DIAGNOSIS — F900 Attention-deficit hyperactivity disorder, predominantly inattentive type: Secondary | ICD-10-CM

## 2021-05-16 NOTE — Telephone Encounter (Signed)
Call to RS

## 2021-05-20 NOTE — Telephone Encounter (Signed)
Patient VM full couldn't lm for follow up appt

## 2021-05-24 ENCOUNTER — Other Ambulatory Visit: Payer: Self-pay | Admitting: Family Medicine

## 2021-05-24 DIAGNOSIS — I10 Essential (primary) hypertension: Secondary | ICD-10-CM

## 2021-06-07 ENCOUNTER — Other Ambulatory Visit: Payer: Self-pay | Admitting: Psychiatry

## 2021-06-07 DIAGNOSIS — F4001 Agoraphobia with panic disorder: Secondary | ICD-10-CM

## 2021-06-08 NOTE — Telephone Encounter (Signed)
VM full

## 2021-06-08 NOTE — Telephone Encounter (Signed)
Please call patient to reschedule appt. She was last seen 10/07/20 with RTC in 6 months. That appt was cancelled.

## 2021-06-21 ENCOUNTER — Other Ambulatory Visit: Payer: Self-pay | Admitting: Psychiatry

## 2021-06-21 ENCOUNTER — Telehealth: Payer: Self-pay | Admitting: Psychiatry

## 2021-06-21 DIAGNOSIS — F4001 Agoraphobia with panic disorder: Secondary | ICD-10-CM

## 2021-06-21 DIAGNOSIS — F900 Attention-deficit hyperactivity disorder, predominantly inattentive type: Secondary | ICD-10-CM

## 2021-06-21 NOTE — Telephone Encounter (Signed)
Pended.

## 2021-06-21 NOTE — Telephone Encounter (Signed)
Next appt is 07/18/21. Alexis Zamora called requesting refills on Vyvanse and Nortriptyline called to: ? ?[facility] [place] [address] ? ?Phone:  [phone]  ?Fax:  [phone]  ? ? ? ? ? ?

## 2021-06-22 MED ORDER — NORTRIPTYLINE HCL 50 MG PO CAPS: 100.0000 mg | ORAL_CAPSULE | Freq: Every day | ORAL | 0 refills | Status: DC

## 2021-07-18 ENCOUNTER — Encounter: Payer: Self-pay | Admitting: Psychiatry

## 2021-07-18 ENCOUNTER — Ambulatory Visit: Payer: No Typology Code available for payment source | Admitting: Psychiatry

## 2021-07-18 DIAGNOSIS — F5105 Insomnia due to other mental disorder: Secondary | ICD-10-CM | POA: Diagnosis not present

## 2021-07-18 DIAGNOSIS — F4001 Agoraphobia with panic disorder: Secondary | ICD-10-CM | POA: Diagnosis not present

## 2021-07-18 DIAGNOSIS — F900 Attention-deficit hyperactivity disorder, predominantly inattentive type: Secondary | ICD-10-CM | POA: Diagnosis not present

## 2021-07-18 MED ORDER — NORTRIPTYLINE HCL 50 MG PO CAPS: 100.0000 mg | ORAL_CAPSULE | Freq: Every day | ORAL | 1 refills | Status: DC

## 2021-07-18 MED ORDER — CLONAZEPAM 0.5 MG PO TABS: 0.7500 mg | ORAL_TABLET | Freq: Every day | ORAL | 1 refills | Status: DC

## 2021-07-18 MED ORDER — LISDEXAMFETAMINE DIMESYLATE 30 MG PO CAPS: 30.0000 mg | ORAL_CAPSULE | Freq: Every day | ORAL | 0 refills | Status: DC

## 2021-07-18 NOTE — Progress Notes (Signed)
Alexis PoundsLorie B Zamora ?[id number] ?1970-07-21 ?51 y.o. ? ?Subjective:  ? ?Patient ID:  Alexis PoundsLorie B Zamora is a 51 y.o. (DOB 1970-07-21) female. ? ?Chief Complaint:  ?Chief Complaint  ?Patient presents with  ? Follow-up  ? Anxiety  ? Sleeping Problem  ? Stress  ? ? ?HPI   ?Alexis Zamora presents to the office today for follow-up of panic and ADD. ? ?seen June 2020.  Vyvanse was reduced at her request from 40 to 30 mg daily.  Her anxiety was higher than it had been.  Been on nortriptyline 125 for years and clonazepam 0.5 mg HS prn. ? ?White coat syndrome with BP. ? ?seen December 2020.  No meds were changed. ? ?09/25/2019 appointment the following is noted: ?More often lorazepam lately twice weekly. ?BP is higher lately and starting meds.   ?Panic last week at work DT work stress. Anxiety is up worrying about BP just the last couple of weeks. ?Some nights can't sleep DT heart racing and worry over BP.  Rarely used lorazepam for sleep.   ?Chronic stress with job and older D on and off drugs and younger D using pot. ?Only caffeine AM.   ?Focus ok with reduction in Vyvanse.  ?Patient reports stable mood and denies depressed or irritable moods.  Denies appetite disturbance.  Patient reports that energy and motivation have been good.  Patient denies any difficulty with concentration.   Patient denies any suicidal ideation. ?Vyvanse works well for attention and focus.  12 hour shifts and constant volume.  Added stress and white coat syndrome and worries over BP even when sh checks it.  ?PLAN: No med changes ? ?02/12/2020 appointment with the following noted: ?Rare lorazepam. ?Added metoprolol which helped white coat hypertension and she's pleased.  Once she knew her BP was ok then her anxiety and sleep got better.   ?Vyvanse working with good duration. ?1 panic since here.  Not depressed. Patient reports stable mood and denies depressed or irritable moods.  Patient denies any recent difficulty with anxiety.  Patient denies difficulty with  sleep initiation or maintenance. Denies appetite disturbance.  Patient reports that energy and motivation have been good.  Patient denies any difficulty with concentration.  Patient denies any suicidal ideation. ?No SE concerns with meds. ?Plan:Continue nortriptyline 125 mg daily which she has been on for years for panic disorder ?Continue clonazepam 0.25 to 0.5 mg nightly for sleep.   ?Continue lorazepam 0.5 mg as needed panic this is used rarely. ?No med changes today are indicated ?Vyvanse 30 ? ?10/07/20 appt noted: ?Rare lorazepam. ?Doiing well.  Busy.  No panic since here. ?Patient reports stable mood and denies depressed or irritable moods.  Patient denies any recent difficulty with anxiety.  Patient denies difficulty with sleep initiation or maintenance. Denies appetite disturbance.  Patient reports that energy and motivation have been good.  Patient denies any difficulty with concentration.  Patient denies any suicidal ideation. ?No SE with meds. ?Checks BP daily and has been fine at home. ?Asks about reducing nortriptyline.  I want to try it. ?Plan: OK trial reduction in nortriptyline to 100 mg HS to get to LED for panic. ?Continue clonazepam 0.25 to 0.5 mg nightly for sleep.   ?Continue lorazepam 0.5 mg as needed panic this is used rarely. ?Continue Vyvanse 30 mg a.m. ? ?07/18/2021 appointment with the following noted: ?Reduced nortriptyline to 100 mg HS. ?More stressed out than ever been in life. ?ExH died [place] at 51 yo.  Been apart  many years, but it's been hard on her D's bc stressed relationship.  She got named executor and she' having to go almost daily to his house to get it ready.  He had no money left.    Been really busy. ?Continue meds. ?Job stressful.  Hasn't taken time off.  M dementia.   ?D's 24, 51 yo. ? ?Past Psychiatric Medication Trials: Nortriptyline 125, Vyvanse 40, clonazepam at bedtime, Ritalin, duloxetine anxiety ?Under our care since 2010 and on nortriptyline and clonazepam since  then ? ?Review of Systems:  ?Review of Systems  ?Cardiovascular:  Negative for chest pain and palpitations.  ?Neurological:  Negative for tremors and weakness.  ?Psychiatric/Behavioral:  Negative for agitation, behavioral problems, confusion, decreased concentration, dysphoric mood, hallucinations, self-injury, sleep disturbance and suicidal ideas. The patient is nervous/anxious. The patient is not hyperactive.   ? ?Medications: I have reviewed the patient's current medications. ? ?Current Outpatient Medications  ?Medication Sig Dispense Refill  ? aspirin 81 MG tablet Take 81 mg by mouth daily.      ? Calcium Carbonate-Vitamin D (CALCIUM 600 + D PO) Take 1 tablet by mouth daily.      ? cephALEXin (KEFLEX) 500 MG capsule Take 1 capsule (500 mg total) by mouth 2 (two) times daily. 14 capsule 0  ? Esomeprazole Magnesium 20 MG TBEC Take 1 tablet by mouth daily.    ? lisdexamfetamine (VYVANSE) 30 MG capsule Take 1 capsule (30 mg total) by mouth every morning. 30 capsule 0  ? lisdexamfetamine (VYVANSE) 30 MG capsule Take 1 capsule (30 mg total) by mouth every morning. 30 capsule 0  ? LORazepam (ATIVAN) 0.5 MG tablet Take 1 tablet (0.5 mg total) by mouth as needed (for panic). 30 tablet 1  ? metoprolol succinate (TOPROL-XL) 50 MG 24 hr tablet Take 1 tablet (50 mg total) by mouth daily. Take with or immediately following a meal. 90 tablet 1  ? Multiple Vitamin (MULTIVITAMIN) tablet Take 1 tablet by mouth daily.      ? polyethylene glycol powder (GLYCOLAX/MIRALAX) powder Take 17 g by mouth daily.    ? VYVANSE 30 MG capsule Take 1 capsule (30 mg total) by mouth daily. 30 capsule 0  ? clonazePAM (KLONOPIN) 0.5 MG tablet Take 1.5 tablets (0.75 mg total) by mouth at bedtime. 135 tablet 1  ? lisdexamfetamine (VYVANSE) 30 MG capsule Take 1 capsule (30 mg total) by mouth daily. 90 capsule 0  ? nortriptyline (PAMELOR) 50 MG capsule Take 2 capsules (100 mg total) by mouth at bedtime. 180 capsule 1  ? ?No current facility-administered  medications for this visit.  ? ? ?Medication Side Effects: None ? ?Allergies: No Known Allergies ? ?Past Medical History:  ?Diagnosis Date  ? Anxiety   ? Carpal tunnel syndrome   ? GERD (gastroesophageal reflux disease)   ? Migraine   ? ? ?Family History  ?Problem Relation Age of Onset  ? Heart attack Maternal Grandfather   ? Colon cancer Other   ? Lupus Mother   ? Arthritis Mother   ? Hyperlipidemia Mother   ? Diabetes Father   ? Hyperlipidemia Father   ? Hypertension Father   ? ? ?Social History  ? ?Socioeconomic History  ? Marital status: Divorced  ?  Spouse name: Not on file  ? Number of children: Not on file  ? Years of education: Not on file  ? Highest education level: Not on file  ?Occupational History  ? Occupation: Human resources officer  ?  Employer: [employer]  ?Tobacco Use  ? Smoking status: Former  ?  Packs/day: 1.50  ?  Years: 18.00  ?  Pack years: 27.00  ?  Types: Cigarettes  ?  Quit date: 09/21/2003  ?  Years since quitting: 17.8  ? Smokeless tobacco: Never  ?Substance and Sexual Activity  ? Alcohol use: No  ? Drug use: No  ? Sexual activity: Not Currently  ?  Partners: Male  ?Other Topics Concern  ? Not on file  ?Social History Narrative  ? Exercise-- no  ? ?Social Determinants of Health  ? ?Financial Resource Strain: Not on file  ?Food Insecurity: Not on file  ?Transportation Needs: Not on file  ?Physical Activity: Not on file  ?Stress: Not on file  ?Social Connections: Not on file  ?Intimate Partner Violence: Not on file  ? ? ?Past Medical History, Surgical history, Social history, and Family history were reviewed and updated as appropriate.  ? ?Please see review of systems for further details on the patient's review from today.  ? ?Objective:  ? ?Physical Exam:  ?There were no vitals taken for this visit. ? ?Physical Exam ?Constitutional:   ?   General: She is not in acute distress. ?Musculoskeletal:     ?   General: No deformity.  ?Neurological:  ?   Mental Status: She is alert and  oriented to person, place, and time.  ?   Coordination: Coordination normal.  ?   Gait: Gait normal.  ?Psychiatric:     ?   Attention and Perception: Attention and perception normal.     ?   Mood and Affect

## 2021-10-19 ENCOUNTER — Other Ambulatory Visit: Payer: Self-pay | Admitting: Psychiatry

## 2021-10-19 DIAGNOSIS — F900 Attention-deficit hyperactivity disorder, predominantly inattentive type: Secondary | ICD-10-CM

## 2021-10-19 NOTE — Telephone Encounter (Signed)
Pharmacy RF request shows last fill of 4/5, but PMP database shows 4/12.  Pharmacy closed at 3:30. Will call tomorrow.

## 2021-10-20 NOTE — Telephone Encounter (Signed)
Pharmacy confirms last dispensed 4/12, due 7/9

## 2021-11-11 ENCOUNTER — Other Ambulatory Visit: Payer: Self-pay | Admitting: Family Medicine

## 2021-11-11 DIAGNOSIS — I10 Essential (primary) hypertension: Secondary | ICD-10-CM

## 2021-12-14 ENCOUNTER — Other Ambulatory Visit: Payer: Self-pay

## 2021-12-14 DIAGNOSIS — I10 Essential (primary) hypertension: Secondary | ICD-10-CM

## 2021-12-14 MED ORDER — METOPROLOL SUCCINATE ER 50 MG PO TB24: 50.0000 mg | ORAL_TABLET | Freq: Every day | ORAL | 0 refills | Status: DC

## 2021-12-16 ENCOUNTER — Ambulatory Visit (INDEPENDENT_AMBULATORY_CARE_PROVIDER_SITE_OTHER): Payer: PRIVATE HEALTH INSURANCE | Admitting: Family Medicine

## 2021-12-16 ENCOUNTER — Ambulatory Visit: Payer: No Typology Code available for payment source | Admitting: Family Medicine

## 2021-12-16 ENCOUNTER — Encounter: Payer: Self-pay | Admitting: Family Medicine

## 2021-12-16 VITALS — BP 120/84 | HR 101 | Temp 98.7°F | Resp 18 | Ht 65.0 in | Wt 229.4 lb

## 2021-12-16 DIAGNOSIS — Z1211 Encounter for screening for malignant neoplasm of colon: Secondary | ICD-10-CM

## 2021-12-16 DIAGNOSIS — I10 Essential (primary) hypertension: Secondary | ICD-10-CM

## 2021-12-16 LAB — CBC WITH DIFFERENTIAL/PLATELET
Basophils Absolute: 0.1 10*3/uL (ref 0.0–0.1)
Basophils Relative: 0.9 % (ref 0.0–3.0)
Eosinophils Absolute: 0.1 10*3/uL (ref 0.0–0.7)
Eosinophils Relative: 2 % (ref 0.0–5.0)
HCT: 41.3 % (ref 36.0–46.0)
Hemoglobin: 13.7 g/dL (ref 12.0–15.0)
Lymphocytes Relative: 26.3 % (ref 12.0–46.0)
Lymphs Abs: 1.5 10*3/uL (ref 0.7–4.0)
MCHC: 33.2 g/dL (ref 30.0–36.0)
MCV: 87.9 fl (ref 78.0–100.0)
Monocytes Absolute: 0.6 10*3/uL (ref 0.1–1.0)
Monocytes Relative: 10.9 % (ref 3.0–12.0)
Neutro Abs: 3.5 10*3/uL (ref 1.4–7.7)
Neutrophils Relative %: 59.9 % (ref 43.0–77.0)
Platelets: 244 10*3/uL (ref 150.0–400.0)
RBC: 4.7 Mil/uL (ref 3.87–5.11)
RDW: 13.4 % (ref 11.5–15.5)
WBC: 5.9 10*3/uL (ref 4.0–10.5)

## 2021-12-16 LAB — LIPID PANEL
Cholesterol: 161 mg/dL (ref 0–200)
HDL: 59.1 mg/dL (ref >39.00)
LDL Cholesterol: 91 mg/dL (ref 0–99)
NonHDL: 101.87
Total CHOL/HDL Ratio: 3
Triglycerides: 55 mg/dL (ref 0.0–149.0)
VLDL: 11 mg/dL (ref 0.0–40.0)

## 2021-12-16 LAB — COMPREHENSIVE METABOLIC PANEL
ALT: 25 U/L (ref 0–35)
AST: 23 U/L (ref 0–37)
Albumin: 4 g/dL (ref 3.5–5.2)
Alkaline Phosphatase: 59 U/L (ref 39–117)
BUN: 12 mg/dL (ref 6–23)
CO2: 30 mEq/L (ref 19–32)
Calcium: 9.2 mg/dL (ref 8.4–10.5)
Chloride: 102 mEq/L (ref 96–112)
Creatinine, Ser: 0.86 mg/dL (ref 0.40–1.20)
GFR: 77.52 mL/min (ref >60.00)
Glucose, Bld: 86 mg/dL (ref 70–99)
Potassium: 4.4 mEq/L (ref 3.5–5.1)
Sodium: 139 mEq/L (ref 135–145)
Total Bilirubin: 0.6 mg/dL (ref 0.2–1.2)
Total Protein: 6.9 g/dL (ref 6.0–8.3)

## 2021-12-16 LAB — TSH: TSH: 2.06 u[IU]/mL (ref 0.35–5.50)

## 2021-12-16 MED ORDER — METOPROLOL SUCCINATE ER 50 MG PO TB24: 50.0000 mg | ORAL_TABLET | Freq: Every day | ORAL | 1 refills | Status: DC

## 2021-12-16 NOTE — Patient Instructions (Signed)

## 2021-12-16 NOTE — Assessment & Plan Note (Signed)
Well controlled, no changes to meds. Encouraged heart healthy diet such as the DASH diet and exercise as tolerated.  °

## 2021-12-16 NOTE — Progress Notes (Signed)
Established Patient Office Visit  Subjective   Patient ID: Alexis Zamora, female    DOB: [birth year]  Age: 51 y.o. MRN: [id number]  Chief Complaint  Patient presents with   Hypertension   Follow-up    HPI Pt here f/u bp.  Pt is under a lot of stress.  Her ex husband died in 05/10/2022 and she is the executor of the estate.   She needs to hold off on the colonoscopy and shingrix because of this   no compliants  Patient Active Problem List   Diagnosis Date Noted   Essential hypertension 10/22/2020   ADD (attention deficit disorder) 04/14/2018   Panic disorder with agoraphobia 04/14/2018   Preventative health care 11/26/2015   Obesity (BMI 30-39.9) 01/10/2013   GUAIAC POSITIVE STOOL 06/16/2010   HEADACHE 09/30/2009   ACUTE TONSILLITIS 07/20/2009   EXCESSIVE MENSTRUAL BLEEDING 07/20/2009   CONJUNCTIVITIS, BACTERIAL 06/16/2009   ANEMIA 12/03/2008   MIGRAINE, CHRONIC 05/02/2007   CARPAL TUNNEL SYNDROME 05/02/2007   GERD (gastroesophageal reflux disease) 05/02/2007   CHEST PAIN 05/02/2007   NUMBNESS, ARM 01/28/2007   ANXIETY 12/06/2006   COMMON MIGRAINE 12/06/2006   GERD 12/06/2006   Past Medical History:  Diagnosis Date   Anxiety    Carpal tunnel syndrome    GERD (gastroesophageal reflux disease)    Migraine    Past Surgical History:  Procedure Laterality Date   ESOPHAGEAL DILATION     ESOPHAGEAL MANOMETRY     LEEP     Social History   Tobacco Use   Smoking status: Former    Packs/day: 1.50    Years: 18.00    Total pack years: 27.00    Types: Cigarettes    Quit date: 09/21/2003    Years since quitting: 18.2   Smokeless tobacco: Never  Substance Use Topics   Alcohol use: No   Drug use: No   Social History   Socioeconomic History   Marital status: Divorced    Spouse name: Not on file   Number of children: Not on file   Years of education: Not on file   Highest education level: Not on file  Occupational History   Occupation: ER--unit Producer, television/film/video: [employer]  Tobacco Use   Smoking status: Former    Packs/day: 1.50    Years: 18.00    Total pack years: 27.00    Types: Cigarettes    Quit date: 09/21/2003    Years since quitting: 18.2   Smokeless tobacco: Never  Substance and Sexual Activity   Alcohol use: No   Drug use: No   Sexual activity: Not Currently    Partners: Male  Other Topics Concern   Not on file  Social History Narrative   Exercise-- no   Social Determinants of Health   Financial Resource Strain: Not on file  Food Insecurity: Not on file  Transportation Needs: Not on file  Physical Activity: Not on file  Stress: Not on file  Social Connections: Not on file  Intimate Partner Violence: Not on file   Family Status  Relation Name Status   Mother  Alive   Father  Alive   MGF  (Not Specified)   Other  (Not Specified)   Family History  Problem Relation Age of Onset   Heart attack Maternal Grandfather    Colon cancer Other    Lupus Mother    Arthritis Mother    Hyperlipidemia Mother    Diabetes Father  Hyperlipidemia Father    Hypertension Father    No Known Allergies    Review of Systems  Constitutional:  Negative for fever and malaise/fatigue.  HENT:  Negative for congestion.   Eyes:  Negative for blurred vision.  Respiratory:  Negative for shortness of breath.   Cardiovascular:  Negative for chest pain, palpitations and leg swelling.  Gastrointestinal:  Negative for abdominal pain, blood in stool and nausea.  Genitourinary:  Negative for dysuria and frequency.  Musculoskeletal:  Negative for falls.  Skin:  Negative for rash.  Neurological:  Negative for dizziness, loss of consciousness and headaches.  Endo/Heme/Allergies:  Negative for environmental allergies.  Psychiatric/Behavioral:  Negative for depression. The patient is not nervous/anxious.       Objective:     BP 120/84 (BP Location: Left Arm, Patient Position: Sitting, Cuff Size: Large)   Pulse (!) 101   Temp 98.7  F (37.1 C) (Oral)   Resp 18   Ht 5\' 5"  (1.651 m)   Wt 229 lb 6.4 oz (104.1 kg)   SpO2 99%   BMI 38.17 kg/m  BP Readings from Last 3 Encounters:  12/16/21 120/84  10/22/20 110/84  09/26/19 (!) 140/108   Wt Readings from Last 3 Encounters:  12/16/21 229 lb 6.4 oz (104.1 kg)  10/22/20 242 lb 12.8 oz (110.1 kg)  09/26/19 234 lb 6.4 oz (106.3 kg)   SpO2 Readings from Last 3 Encounters:  12/16/21 99%  10/22/20 99%  09/26/19 98%      Physical Exam Vitals and nursing note reviewed.  Constitutional:      Appearance: She is well-developed.  HENT:     Head: Normocephalic and atraumatic.  Eyes:     Conjunctiva/sclera: Conjunctivae normal.  Neck:     Thyroid: No thyromegaly.     Vascular: No carotid bruit or JVD.  Cardiovascular:     Rate and Rhythm: Normal rate and regular rhythm.     Heart sounds: Normal heart sounds. No murmur heard. Pulmonary:     Effort: Pulmonary effort is normal. No respiratory distress.     Breath sounds: Normal breath sounds. No wheezing or rales.  Chest:     Chest Stoudt: No tenderness.  Musculoskeletal:     Cervical back: Normal range of motion and neck supple.  Skin:    Capillary Refill: Capillary refill takes less than 2 seconds.  Neurological:     Mental Status: She is alert and oriented to person, place, and time.  Psychiatric:        Mood and Affect: Mood normal.        Thought Content: Thought content normal.      No results found for any visits on 12/16/21.  Last CBC Lab Results  Component Value Date   WBC 6.6 10/22/2020   HGB 13.2 10/22/2020   HCT 40.2 10/22/2020   MCV 86.6 10/22/2020   MCH 27.6 09/26/2019   RDW 14.8 10/22/2020   PLT 277.0 10/22/2020   Last metabolic panel Lab Results  Component Value Date   GLUCOSE 91 10/22/2020   NA 136 10/22/2020   K 4.4 10/22/2020   CL 98 10/22/2020   CO2 30 10/22/2020   BUN 13 10/22/2020   CREATININE 0.93 10/22/2020   CALCIUM 9.6 10/22/2020   PROT 7.1 10/22/2020   ALBUMIN 4.3  10/22/2020   BILITOT 0.7 10/22/2020   ALKPHOS 57 10/22/2020   AST 37 10/22/2020   ALT 39 (H) 10/22/2020   Last lipids Lab Results  Component Value Date  CHOL 167 10/22/2020   HDL 60.10 10/22/2020   LDLCALC 91 10/22/2020   TRIG 81.0 10/22/2020   CHOLHDL 3 10/22/2020   Last hemoglobin A1c No results found for: "HGBA1C" Last thyroid functions Lab Results  Component Value Date   TSH 4.25 10/22/2020   T4TOTAL 6.8 12/10/2015   Last vitamin D No results found for: "25OHVITD2", "25OHVITD3", "VD25OH" Last vitamin B12 and Folate Lab Results  Component Value Date   VITAMINB12 538 06/16/2010   FOLATE 24.5 06/16/2010      The 10-year ASCVD risk score (Arnett DK, et al., 2019) is: 1.3%    Assessment & Plan:   Problem List Items Addressed This Visit       Unprioritized   Essential hypertension - Primary    Well controlled, no changes to meds. Encouraged heart healthy diet such as the DASH diet and exercise as tolerated.       Relevant Medications   metoprolol succinate (TOPROL-XL) 50 MG 24 hr tablet   Other Relevant Orders   CBC with Differential/Platelet   Comprehensive metabolic panel   Lipid panel   TSH   Other Visit Diagnoses     Colon cancer screening       Relevant Orders   Cologuard       Return in about 6 months (around 06/16/2022), or if symptoms worsen or fail to improve, for annual exam, fasting.    Donato Schultz, DO

## 2022-01-24 ENCOUNTER — Other Ambulatory Visit: Payer: Self-pay | Admitting: Psychiatry

## 2022-01-24 DIAGNOSIS — F900 Attention-deficit hyperactivity disorder, predominantly inattentive type: Secondary | ICD-10-CM

## 2022-01-24 DIAGNOSIS — F4001 Agoraphobia with panic disorder: Secondary | ICD-10-CM

## 2022-01-25 NOTE — Telephone Encounter (Signed)
Voice mail is full  

## 2022-01-25 NOTE — Telephone Encounter (Signed)
Please schedule appt

## 2022-01-26 ENCOUNTER — Telehealth: Payer: Self-pay

## 2022-01-26 ENCOUNTER — Other Ambulatory Visit: Payer: Self-pay

## 2022-01-26 DIAGNOSIS — F5105 Insomnia due to other mental disorder: Secondary | ICD-10-CM

## 2022-01-26 DIAGNOSIS — F4001 Agoraphobia with panic disorder: Secondary | ICD-10-CM

## 2022-01-26 NOTE — Telephone Encounter (Signed)
Last seen 4/3 due back this month

## 2022-01-26 NOTE — Telephone Encounter (Signed)
Call to RS

## 2022-01-27 ENCOUNTER — Other Ambulatory Visit: Payer: Self-pay

## 2022-01-27 ENCOUNTER — Other Ambulatory Visit: Payer: Self-pay | Admitting: Psychiatry

## 2022-01-27 DIAGNOSIS — F5105 Insomnia due to other mental disorder: Secondary | ICD-10-CM

## 2022-01-27 DIAGNOSIS — F4001 Agoraphobia with panic disorder: Secondary | ICD-10-CM

## 2022-01-27 DIAGNOSIS — F900 Attention-deficit hyperactivity disorder, predominantly inattentive type: Secondary | ICD-10-CM

## 2022-01-27 MED ORDER — LISDEXAMFETAMINE DIMESYLATE 30 MG PO CAPS: 30.0000 mg | ORAL_CAPSULE | ORAL | 0 refills | Status: DC

## 2022-01-27 MED ORDER — CLONAZEPAM 0.5 MG PO TABS: 0.7500 mg | ORAL_TABLET | Freq: Every day | ORAL | 0 refills | Status: DC

## 2022-01-27 MED ORDER — LISDEXAMFETAMINE DIMESYLATE 30 MG PO CAPS: 30.0000 mg | ORAL_CAPSULE | Freq: Every morning | ORAL | 0 refills | Status: DC

## 2022-01-27 MED ORDER — NORTRIPTYLINE HCL 50 MG PO CAPS: 100.0000 mg | ORAL_CAPSULE | Freq: Every day | ORAL | 1 refills | Status: DC

## 2022-01-27 NOTE — Telephone Encounter (Signed)
Pt is scheduled in january 

## 2022-01-27 NOTE — Telephone Encounter (Signed)
I sent Vyvanse and clonazepam.  Pls send nortriptyline RX

## 2022-01-27 NOTE — Telephone Encounter (Signed)
Rx sent 

## 2022-01-27 NOTE — Telephone Encounter (Signed)
Pended.

## 2022-01-27 NOTE — Telephone Encounter (Signed)
Next appt is 04/25/22 with Dr. Clovis Pu. Requesting a refill on her Nortriptyline, Clonazepam, and Vyvanse 30 mg. She would like to see if she can use generic Vyvanse that recently came out instead of the name brand? Her pharmacy: Is:  [facility], [place]  Phone:  [phone]  Fax:  [phone]

## 2022-02-23 LAB — COLOGUARD: COLOGUARD: NEGATIVE

## 2022-03-01 ENCOUNTER — Other Ambulatory Visit: Payer: Self-pay | Admitting: Psychiatry

## 2022-03-01 DIAGNOSIS — F900 Attention-deficit hyperactivity disorder, predominantly inattentive type: Secondary | ICD-10-CM

## 2022-04-17 ENCOUNTER — Other Ambulatory Visit: Payer: Self-pay | Admitting: Psychiatry

## 2022-04-17 DIAGNOSIS — F900 Attention-deficit hyperactivity disorder, predominantly inattentive type: Secondary | ICD-10-CM

## 2022-04-18 NOTE — Telephone Encounter (Signed)
Filled 11/17 appt 1/9

## 2022-04-25 ENCOUNTER — Encounter: Payer: Self-pay | Admitting: Psychiatry

## 2022-04-25 ENCOUNTER — Ambulatory Visit (INDEPENDENT_AMBULATORY_CARE_PROVIDER_SITE_OTHER): Payer: No Typology Code available for payment source | Admitting: Psychiatry

## 2022-04-25 VITALS — BP 141/98 | HR 98

## 2022-04-25 DIAGNOSIS — F5105 Insomnia due to other mental disorder: Secondary | ICD-10-CM | POA: Diagnosis not present

## 2022-04-25 DIAGNOSIS — F900 Attention-deficit hyperactivity disorder, predominantly inattentive type: Secondary | ICD-10-CM

## 2022-04-25 DIAGNOSIS — F4001 Agoraphobia with panic disorder: Secondary | ICD-10-CM

## 2022-04-25 MED ORDER — NORTRIPTYLINE HCL 50 MG PO CAPS: 100.0000 mg | ORAL_CAPSULE | Freq: Every day | ORAL | 1 refills | Status: DC

## 2022-04-25 MED ORDER — LISDEXAMFETAMINE DIMESYLATE 30 MG PO CAPS: 30.0000 mg | ORAL_CAPSULE | Freq: Every morning | ORAL | 0 refills | Status: DC

## 2022-04-25 MED ORDER — CLONAZEPAM 0.5 MG PO TABS: 0.7500 mg | ORAL_TABLET | Freq: Every day | ORAL | 1 refills | Status: DC

## 2022-04-25 NOTE — Progress Notes (Signed)
Alexis Zamora [id number] 01-Jan-1971 52 y.o.  Subjective:   Patient ID:  Alexis Zamora is a 52 y.o. (DOB 08-30-1970) female.  Chief Complaint:  Chief Complaint  Patient presents with   Follow-up    Panic disorder with agoraphobia   ADHD    HPI   Alexis Zamora presents to the office today for follow-up of panic and ADD.  seen June 2020.  Vyvanse was reduced at her request from 40 to 30 mg daily.  Her anxiety was higher than it had been.  Been on nortriptyline 125 for years and clonazepam 0.5 mg HS prn.  White coat syndrome with BP.  seen December 2020.  No meds were changed.  09/25/2019 appointment the following is noted: More often lorazepam lately twice weekly. BP is higher lately and starting meds.   Panic last week at work DT work stress. Anxiety is up worrying about BP just the last couple of weeks. Some nights can't sleep DT heart racing and worry over BP.  Rarely used lorazepam for sleep.   Chronic stress with job and older D on and off drugs and younger D using pot. Only caffeine AM.   Focus ok with reduction in Vyvanse.  Patient reports stable mood and denies depressed or irritable moods.  Denies appetite disturbance.  Patient reports that energy and motivation have been good.  Patient denies any difficulty with concentration.   Patient denies any suicidal ideation. Vyvanse works well for attention and focus.  12 hour shifts and constant volume.  Added stress and white coat syndrome and worries over BP even when sh checks it.  PLAN: No med changes  02/12/2020 appointment with the following noted: Rare lorazepam. Added metoprolol which helped white coat hypertension and she's pleased.  Once she knew her BP was ok then her anxiety and sleep got better.   Vyvanse working with good duration. 1 panic since here.  Not depressed. Patient reports stable mood and denies depressed or irritable moods.  Patient denies any recent difficulty with anxiety.  Patient denies difficulty with  sleep initiation or maintenance. Denies appetite disturbance.  Patient reports that energy and motivation have been good.  Patient denies any difficulty with concentration.  Patient denies any suicidal ideation. No SE concerns with meds. Plan:Continue nortriptyline 125 mg daily which she has been on for years for panic disorder Continue clonazepam 0.25 to 0.5 mg nightly for sleep.   Continue lorazepam 0.5 mg as needed panic this is used rarely. No med changes today are indicated Vyvanse 30  10/07/20 appt noted: Rare lorazepam. Doiing well.  Busy.  No panic since here. Patient reports stable mood and denies depressed or irritable moods.  Patient denies any recent difficulty with anxiety.  Patient denies difficulty with sleep initiation or maintenance. Denies appetite disturbance.  Patient reports that energy and motivation have been good.  Patient denies any difficulty with concentration.  Patient denies any suicidal ideation. No SE with meds. Checks BP daily and has been fine at home. Asks about reducing nortriptyline.  I want to try it. Plan: OK trial reduction in nortriptyline to 100 mg HS to get to LED for panic. Continue clonazepam 0.25 to 0.5 mg nightly for sleep.   Continue lorazepam 0.5 mg as needed panic this is used rarely. Continue Vyvanse 30 mg a.m.  07/18/2021 appointment with the following noted: Reduced nortriptyline to 100 mg HS. More stressed out than ever been in life. ExH died [place] at 52 yo.  Been apart  many years, but it's been hard on her D's bc stressed relationship.  She got named executor and she' having to go almost daily to his house to get it ready.  He had no money left.    Been really busy. Continue meds. Job stressful.  Hasn't taken time off.  M dementia.   D's 62, 54 yo.  04/25/21 appt noted: Continues meds as above. No lorazepam used. Real busy with work. Ex H died early last year and named her as executor with no money and debt. She hopes it will end  soon. No panic.  Anxiety is understandable and functions well.  Not enough sleep at times bc stays up too late. Generic vyvanse same effect with always lull about 3 pm.  Not worth thrying to increase dose. No napping.   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  Past Psychiatric Medication Trials: Nortriptyline 125, duloxetine anxiety Vyvanse 40, Ritalin, clonazepam at bedtime,   Under our care since 2010 and on nortriptyline and clonazepam since then  Review of Systems:  Review of Systems  Cardiovascular:  Negative for chest pain and palpitations.  Neurological:  Negative for tremors.  Psychiatric/Behavioral:  Negative for agitation, behavioral problems, confusion, decreased concentration, dysphoric mood, hallucinations, self-injury, sleep disturbance and suicidal ideas. The patient is nervous/anxious. The patient is not hyperactive.     Medications: I have reviewed the patient's current medications.  Current Outpatient Medications  Medication Sig Dispense Refill   aspirin 81 MG tablet Take 81 mg by mouth daily.       Calcium Carbonate-Vitamin D (CALCIUM 600 + D PO) Take 1 tablet by mouth daily.       Esomeprazole Magnesium 20 MG TBEC Take 1 tablet by mouth daily.     lisdexamfetamine (VYVANSE) 30 MG capsule Take 1 capsule (30 mg total) by mouth every morning. 30 capsule 0   metoprolol succinate (TOPROL-XL) 50 MG 24 hr tablet Take 1 tablet (50 mg total) by mouth daily. Take with or immediately following a meal. 90 tablet 1   Multiple Vitamin (MULTIVITAMIN) tablet Take 1 tablet by mouth daily.       polyethylene glycol powder (GLYCOLAX/MIRALAX) powder Take 17 g by mouth daily.     clonazePAM (KLONOPIN) 0.5 MG tablet Take 1.5 tablets (0.75 mg total) by mouth at bedtime. 135 tablet 1   lisdexamfetamine (VYVANSE) 30 MG capsule Take 1 capsule (30 mg total) by mouth every morning. 90 capsule 0   LORazepam (ATIVAN) 0.5 MG tablet Take 1 tablet (0.5 mg total) by mouth as needed (for panic). (Patient  not taking: Reported on 04/25/2022) 30 tablet 1   nortriptyline (PAMELOR) 50 MG capsule Take 2 capsules (100 mg total) by mouth at bedtime. 180 capsule 1   No current facility-administered medications for this visit.    Medication Side Effects: None  Allergies: No Known Allergies  Past Medical History:  Diagnosis Date   Anxiety    Carpal tunnel syndrome    GERD (gastroesophageal reflux disease)    Migraine     Family History  Problem Relation Age of Onset   Heart attack Maternal Grandfather    Colon cancer Other    Lupus Mother    Arthritis Mother    Hyperlipidemia Mother    Diabetes Father    Hyperlipidemia Father    Hypertension Father     Social History   Socioeconomic History   Marital status: Divorced    Spouse name: Not on file   Number of children: Not  on file   Years of education: Not on file   Highest education level: Not on file  Occupational History   Occupation: ER--unit Producer, television/film/video: [employer]  Tobacco Use   Smoking status: Former    Packs/day: 1.50    Years: 18.00    Total pack years: 27.00    Types: Cigarettes    Quit date: 09/21/2003    Years since quitting: 18.6   Smokeless tobacco: Never  Substance and Sexual Activity   Alcohol use: No   Drug use: No   Sexual activity: Not Currently    Partners: Male  Other Topics Concern   Not on file  Social History Narrative   Exercise-- no   Social Determinants of Health   Financial Resource Strain: Not on file  Food Insecurity: Not on file  Transportation Needs: Not on file  Physical Activity: Not on file  Stress: Not on file  Social Connections: Not on file  Intimate Partner Violence: Not on file    Past Medical History, Surgical history, Social history, and Family history were reviewed and updated as appropriate.   Please see review of systems for further details on the patient's review from today.   Objective:   Physical Exam:  BP (!) 141/98   Pulse 98    Physical Exam Constitutional:      General: She is not in acute distress. Musculoskeletal:        General: No deformity.  Neurological:     Mental Status: She is alert and oriented to person, place, and time.     Coordination: Coordination normal.     Gait: Gait normal.  Psychiatric:        Attention and Perception: Attention and perception normal.        Mood and Affect: Mood is not anxious or depressed. Affect is not labile, blunt, angry, tearful or inappropriate.        Speech: Speech normal. Speech is not slurred.        Behavior: Behavior normal.        Thought Content: Thought content normal. Thought content is not delusional. Thought content does not include homicidal or suicidal ideation. Thought content does not include suicidal plan.        Cognition and Memory: Cognition normal.        Judgment: Judgment normal.     Comments: Insight intact. No auditory or visual hallucinations. No delusions.       Lab Review:     Component Value Date/Time   NA 139 12/16/2021 1020   K 4.4 12/16/2021 1020   CL 102 12/16/2021 1020   CO2 30 12/16/2021 1020   GLUCOSE 86 12/16/2021 1020   BUN 12 12/16/2021 1020   CREATININE 0.86 12/16/2021 1020   CREATININE 0.91 09/26/2019 1559   CALCIUM 9.2 12/16/2021 1020   PROT 6.9 12/16/2021 1020   ALBUMIN 4.0 12/16/2021 1020   AST 23 12/16/2021 1020   ALT 25 12/16/2021 1020   ALKPHOS 59 12/16/2021 1020   BILITOT 0.6 12/16/2021 1020   GFRNONAA 105.17 09/30/2009 1556   GFRAA 90 06/18/2008 0940       Component Value Date/Time   WBC 5.9 12/16/2021 1020   RBC 4.70 12/16/2021 1020   HGB 13.7 12/16/2021 1020   HCT 41.3 12/16/2021 1020   PLT 244.0 12/16/2021 1020   MCV 87.9 12/16/2021 1020   MCH 27.6 09/26/2019 1559   MCHC 33.2 12/16/2021 1020   RDW 13.4 12/16/2021 1020  LYMPHSABS 1.5 12/16/2021 1020   MONOABS 0.6 12/16/2021 1020   EOSABS 0.1 12/16/2021 1020   BASOSABS 0.1 12/16/2021 1020    No results found for: "POCLITH",  "LITHIUM"   No results found for: "PHENYTOIN", "PHENOBARB", "VALPROATE", "CBMZ"   .res Assessment: Plan:    Alexis Zamora was seen today for follow-up and adhd.  Diagnoses and all orders for this visit:  Panic disorder with agoraphobia -     nortriptyline (PAMELOR) 50 MG capsule; Take 2 capsules (100 mg total) by mouth at bedtime. -     clonazePAM (KLONOPIN) 0.5 MG tablet; Take 1.5 tablets (0.75 mg total) by mouth at bedtime.  Attention deficit hyperactivity disorder (ADHD), predominantly inattentive type -     lisdexamfetamine (VYVANSE) 30 MG capsule; Take 1 capsule (30 mg total) by mouth every morning.  Insomnia due to mental condition -     clonazePAM (KLONOPIN) 0.5 MG tablet; Take 1.5 tablets (0.75 mg total) by mouth at bedtime.   Greater than 50% of 30 min face to face time with patient was spent on counseling and coordination of care. We discussed the following:  The addition of the beta-blocker there are a lot to help her anxiety because addressing the white coat syndrome hypertension helped to reduce her anxiety significantly.  She became less worried over her heart and her health and less prone to panic.   She is very pleased with the meds overall and is tolerating meds well.  OK with reduction in nortriptyline to 100 mg HS to get to LED for panic. Discussed the risk of reducing nortriptyline to the level it no longer prevents panic.  However her anxiety is better since the beta-blocker was added in she may be able to get by with less nortriptyline.  Satisfied with meds  Vyvanse 30 mg. BC of additonal stress and anxiety and worry over BP .  Nothing likely to improve if dose is reduced.  Wonders about increase but doesn't want to do so.  Using discount card.  Discussed potential benefits, risks, and side effects of stimulants with patient to include increased heart rate, palpitations, insomnia, increased anxiety, increased irritability, or decreased appetite.  Instructed patient to  contact office if experiencing any significant tolerability issues.  Disc BP concerns with stimulants in detail.  Disc BP issues and her's bein up  at MD offices anf fine at home.  Continue clonazepam 0.25 to 0.5 mg nightly for sleep.   Continue lorazepam 0.5 mg as needed panic this is used rarely. No med changes today are indicated Would suggest nortriptyline level by age 52 just to make sure it's not creeping up DT greater risks with age.d  Disc potential for blood pressure meds and choices.  All other things being equal a beta blocker would be a good choice bc of potential benefit for anxiety and the tendency  The addition of the beta-blocker also helped her to have less anxiety around sleep and she is able to sleep with clonazepam 0.5 mg nightly  We discussed the short-term risks associated with benzodiazepines including sedation and increased fall risk among others.  Discussed long-term side effect risk including dependence, potential withdrawal symptoms, and the potential eventual dose-related risk of dementia.  But recent studies from 2020 dispute this association between benzodiazepines and dementia risk. Newer studies in 2020 do not support an association with dementia.  As much self care as possible  FU 6 mos  Lynder Parents, MD, DFAPA    Please see After Visit Summary for  patient specific instructions.  Future Appointments  Date Time Provider Department Center  06/16/2022  8:40 AM Seabron Spates R, DO [id number]    No orders of the defined types were placed in this encounter.     -------------------------------

## 2022-06-16 ENCOUNTER — Ambulatory Visit: Payer: PRIVATE HEALTH INSURANCE | Admitting: Family Medicine

## 2022-07-03 ENCOUNTER — Ambulatory Visit: Payer: PRIVATE HEALTH INSURANCE | Admitting: Family Medicine

## 2022-07-13 ENCOUNTER — Other Ambulatory Visit: Payer: Self-pay | Admitting: *Deleted

## 2022-07-13 ENCOUNTER — Encounter: Payer: Self-pay | Admitting: *Deleted

## 2022-07-13 DIAGNOSIS — I10 Essential (primary) hypertension: Secondary | ICD-10-CM

## 2022-07-13 MED ORDER — METOPROLOL SUCCINATE ER 50 MG PO TB24: 50.0000 mg | ORAL_TABLET | Freq: Every day | ORAL | 0 refills | Status: DC

## 2022-07-25 ENCOUNTER — Ambulatory Visit: Payer: PRIVATE HEALTH INSURANCE | Admitting: Family Medicine

## 2022-08-24 ENCOUNTER — Ambulatory Visit: Payer: PRIVATE HEALTH INSURANCE | Admitting: Family Medicine

## 2022-09-22 ENCOUNTER — Ambulatory Visit: Payer: PRIVATE HEALTH INSURANCE | Admitting: Family Medicine

## 2022-10-03 ENCOUNTER — Other Ambulatory Visit: Payer: Self-pay | Admitting: Psychiatry

## 2022-10-10 ENCOUNTER — Ambulatory Visit: Payer: PRIVATE HEALTH INSURANCE | Admitting: Family Medicine

## 2022-10-17 ENCOUNTER — Other Ambulatory Visit: Payer: Self-pay | Admitting: Family Medicine

## 2022-10-17 DIAGNOSIS — I10 Essential (primary) hypertension: Secondary | ICD-10-CM

## 2022-10-20 LAB — HM MAMMOGRAPHY

## 2022-11-06 ENCOUNTER — Other Ambulatory Visit: Payer: Self-pay

## 2022-11-06 DIAGNOSIS — F4001 Agoraphobia with panic disorder: Secondary | ICD-10-CM

## 2022-11-06 MED ORDER — NORTRIPTYLINE HCL 50 MG PO CAPS: 100.0000 mg | ORAL_CAPSULE | Freq: Every day | ORAL | 0 refills | Status: DC

## 2022-11-07 ENCOUNTER — Ambulatory Visit: Payer: PRIVATE HEALTH INSURANCE | Admitting: Family Medicine

## 2022-11-09 ENCOUNTER — Other Ambulatory Visit: Payer: Self-pay | Admitting: Psychiatry

## 2022-11-15 ENCOUNTER — Encounter (INDEPENDENT_AMBULATORY_CARE_PROVIDER_SITE_OTHER): Payer: Self-pay

## 2022-12-05 ENCOUNTER — Ambulatory Visit: Payer: PRIVATE HEALTH INSURANCE | Admitting: Family Medicine

## 2022-12-11 ENCOUNTER — Other Ambulatory Visit: Payer: Self-pay | Admitting: Psychiatry

## 2022-12-11 DIAGNOSIS — F4001 Agoraphobia with panic disorder: Secondary | ICD-10-CM

## 2022-12-11 DIAGNOSIS — F5105 Insomnia due to other mental disorder: Secondary | ICD-10-CM

## 2022-12-19 ENCOUNTER — Ambulatory Visit: Payer: PRIVATE HEALTH INSURANCE | Admitting: Family Medicine

## 2023-01-16 ENCOUNTER — Other Ambulatory Visit: Payer: Self-pay | Admitting: Family Medicine

## 2023-01-16 DIAGNOSIS — I10 Essential (primary) hypertension: Secondary | ICD-10-CM

## 2023-02-01 ENCOUNTER — Ambulatory Visit: Payer: No Typology Code available for payment source | Admitting: Psychiatry

## 2023-02-02 NOTE — Progress Notes (Signed)
No show

## 2023-02-04 ENCOUNTER — Other Ambulatory Visit: Payer: Self-pay | Admitting: Psychiatry

## 2023-02-04 DIAGNOSIS — F4001 Agoraphobia with panic disorder: Secondary | ICD-10-CM

## 2023-02-05 NOTE — Telephone Encounter (Signed)
Please schedule pt appt.  

## 2023-02-05 NOTE — Telephone Encounter (Signed)
Mailbox is full can't leave a message

## 2023-02-05 NOTE — Telephone Encounter (Signed)
Lv 04/3; lf 07/22; nv- needs appt. No showed on 10/17

## 2023-02-19 NOTE — Telephone Encounter (Signed)
Call to RS

## 2023-03-04 ENCOUNTER — Other Ambulatory Visit: Payer: Self-pay | Admitting: Psychiatry

## 2023-03-04 DIAGNOSIS — F4001 Agoraphobia with panic disorder: Secondary | ICD-10-CM

## 2023-03-12 ENCOUNTER — Encounter: Payer: Self-pay | Admitting: Family Medicine

## 2023-03-12 ENCOUNTER — Ambulatory Visit: Payer: PRIVATE HEALTH INSURANCE | Admitting: Family Medicine

## 2023-03-12 ENCOUNTER — Ambulatory Visit (HOSPITAL_BASED_OUTPATIENT_CLINIC_OR_DEPARTMENT_OTHER)
Admission: RE | Admit: 2023-03-12 | Discharge: 2023-03-12 | Disposition: A | Discharge status: Home / Self Care | Payer: PRIVATE HEALTH INSURANCE | Source: Ambulatory Visit | Attending: Family Medicine | Admitting: Family Medicine

## 2023-03-12 VITALS — BP 120/90 | HR 104 | Temp 99.0°F | Resp 18 | Ht 65.0 in | Wt 226.2 lb

## 2023-03-12 DIAGNOSIS — F4001 Agoraphobia with panic disorder: Secondary | ICD-10-CM | POA: Diagnosis not present

## 2023-03-12 DIAGNOSIS — I10 Essential (primary) hypertension: Secondary | ICD-10-CM | POA: Diagnosis not present

## 2023-03-12 DIAGNOSIS — M25561 Pain in right knee: Secondary | ICD-10-CM | POA: Insufficient documentation

## 2023-03-12 DIAGNOSIS — G8929 Other chronic pain: Secondary | ICD-10-CM

## 2023-03-12 DIAGNOSIS — Z0000 Encounter for general adult medical examination without abnormal findings: Secondary | ICD-10-CM

## 2023-03-12 DIAGNOSIS — M25552 Pain in left hip: Secondary | ICD-10-CM

## 2023-03-12 DIAGNOSIS — R600 Localized edema: Secondary | ICD-10-CM

## 2023-03-12 LAB — CBC WITH DIFFERENTIAL/PLATELET
Basophils Absolute: 0.1 10*3/uL (ref 0.0–0.1)
Basophils Relative: 1 % (ref 0.0–3.0)
Eosinophils Absolute: 0.1 10*3/uL (ref 0.0–0.7)
Eosinophils Relative: 1.9 % (ref 0.0–5.0)
HCT: 41.8 % (ref 36.0–46.0)
Hemoglobin: 13.8 g/dL (ref 12.0–15.0)
Lymphocytes Relative: 23.6 % (ref 12.0–46.0)
Lymphs Abs: 1.4 10*3/uL (ref 0.7–4.0)
MCHC: 32.9 g/dL (ref 30.0–36.0)
MCV: 88.7 fL (ref 78.0–100.0)
Monocytes Absolute: 0.7 10*3/uL (ref 0.1–1.0)
Monocytes Relative: 11.2 % (ref 3.0–12.0)
Neutro Abs: 3.7 10*3/uL (ref 1.4–7.7)
Neutrophils Relative %: 62.3 % (ref 43.0–77.0)
Platelets: 255 10*3/uL (ref 150.0–400.0)
RBC: 4.71 Mil/uL (ref 3.87–5.11)
RDW: 13.5 % (ref 11.5–15.5)
WBC: 5.9 10*3/uL (ref 4.0–10.5)

## 2023-03-12 LAB — LIPID PANEL
Cholesterol: 160 mg/dL (ref 0–200)
HDL: 52.3 mg/dL (ref >39.00)
LDL Cholesterol: 91 mg/dL (ref 0–99)
NonHDL: 107.66
Total CHOL/HDL Ratio: 3
Triglycerides: 83 mg/dL (ref 0.0–149.0)
VLDL: 16.6 mg/dL (ref 0.0–40.0)

## 2023-03-12 LAB — COMPREHENSIVE METABOLIC PANEL
ALT: 17 U/L (ref 0–35)
AST: 17 U/L (ref 0–37)
Albumin: 4.2 g/dL (ref 3.5–5.2)
Alkaline Phosphatase: 57 U/L (ref 39–117)
BUN: 12 mg/dL (ref 6–23)
CO2: 29 meq/L (ref 19–32)
Calcium: 9.3 mg/dL (ref 8.4–10.5)
Chloride: 104 meq/L (ref 96–112)
Creatinine, Ser: 0.85 mg/dL (ref 0.40–1.20)
GFR: 77.94 mL/min (ref >60.00)
Glucose, Bld: 93 mg/dL (ref 70–99)
Potassium: 4.7 meq/L (ref 3.5–5.1)
Sodium: 142 meq/L (ref 135–145)
Total Bilirubin: 0.5 mg/dL (ref 0.2–1.2)
Total Protein: 6.8 g/dL (ref 6.0–8.3)

## 2023-03-12 MED ORDER — METOPROLOL SUCCINATE ER 50 MG PO TB24: 50.0000 mg | ORAL_TABLET | Freq: Every day | ORAL | 3 refills | Status: DC

## 2023-03-12 MED ORDER — HYDROCHLOROTHIAZIDE 25 MG PO TABS: 25.0000 mg | ORAL_TABLET | Freq: Every day | ORAL | 3 refills | Status: AC

## 2023-03-12 NOTE — Progress Notes (Signed)
Established Patient Office Visit  Subjective   Patient ID: Alexis Zamora, female    DOB: [birth year]  Age: 52 y.o. MRN: [id number]  Chief Complaint  Patient presents with   Hypertension   Follow-up    HPI Discussed the use of AI scribe software for clinical note transcription with the patient, who gave verbal consent to proceed.  History of Present Illness   The patient, with a history of meniscal tear and chronic lower extremity pain, presents with worsening hip pain and bilateral lower extremity swelling. The hip pain, which has been more bothersome than the knee pain, is located on the left side and occasionally radiates down the leg. The pain is severe enough to affect the patient's gait and ability to perform daily activities such as climbing stairs and getting into a car. The patient also reports a throbbing sensation in the lower leg at night. The pain in the knee began a couple of years ago following a meniscal tear, for which the patient declined surgery. The patient has been managing the pain with over-the-counter ibuprofen and Tylenol, and recently started using over-the-counter Well Knee Patches, which have provided significant relief for the knee pain.  The patient also reports swelling in the lower extremities, which is worse at the end of the workday. The swelling is present even upon waking up and leaves indentations from socks. The patient has a sedentary job and often has long periods without breaks.  The patient has a history of three back surgeries, but denies any current back pain. The patient also reports a daily habit of teeth grinding, which she attributes to stress.      Patient Active Problem List   Diagnosis Date Noted   Essential hypertension 10/22/2020   ADD (attention deficit disorder) 04/14/2018   Panic disorder with agoraphobia 04/14/2018   Preventative health care 11/26/2015   Obesity (BMI 30-39.9) 01/10/2013   GUAIAC POSITIVE STOOL 06/16/2010    HEADACHE 09/30/2009   ACUTE TONSILLITIS 07/20/2009   EXCESSIVE MENSTRUAL BLEEDING 07/20/2009   CONJUNCTIVITIS, BACTERIAL 06/16/2009   ANEMIA 12/03/2008   MIGRAINE, CHRONIC 05/02/2007   CARPAL TUNNEL SYNDROME 05/02/2007   GERD (gastroesophageal reflux disease) 05/02/2007   CHEST PAIN 05/02/2007   NUMBNESS, ARM 01/28/2007   ANXIETY 12/06/2006   COMMON MIGRAINE 12/06/2006   GERD 12/06/2006   Past Medical History:  Diagnosis Date   Anxiety    Carpal tunnel syndrome    GERD (gastroesophageal reflux disease)    Migraine    Past Surgical History:  Procedure Laterality Date   ESOPHAGEAL DILATION     ESOPHAGEAL MANOMETRY     LEEP     Social History   Tobacco Use   Smoking status: Former    Current packs/day: 0.00    Average packs/day: 1.5 packs/day for 18.0 years (27.0 ttl pk-yrs)    Types: Cigarettes    Start date: 09/20/1985    Quit date: 09/21/2003    Years since quitting: 19.4   Smokeless tobacco: Never  Substance Use Topics   Alcohol use: No   Drug use: No   Social History   Socioeconomic History   Marital status: Divorced    Spouse name: Not on file   Number of children: Not on file   Years of education: Not on file   Highest education level: Some college, no degree  Occupational History   Occupation: ER--unit Producer, television/film/video: [employer]  Tobacco Use   Smoking status: Former  Current packs/day: 0.00    Average packs/day: 1.5 packs/day for 18.0 years (27.0 ttl pk-yrs)    Types: Cigarettes    Start date: 09/20/1985    Quit date: 09/21/2003    Years since quitting: 19.4   Smokeless tobacco: Never  Substance and Sexual Activity   Alcohol use: No   Drug use: No   Sexual activity: Not Currently    Partners: Male  Other Topics Concern   Not on file  Social History Narrative   Exercise-- no   Social Determinants of Health   Financial Resource Strain: High Risk (03/12/2023)   Overall Financial Resource Strain (CARDIA)    Difficulty of  Paying Living Expenses: Very hard  Food Insecurity: No Food Insecurity (03/12/2023)   Hunger Vital Sign    Worried About Running Out of Food in the Last Year: Never true    Ran Out of Food in the Last Year: Never true  Transportation Needs: No Transportation Needs (03/12/2023)   PRAPARE - Administrator, Civil Service (Medical): No    Lack of Transportation (Non-Medical): No  Physical Activity: Unknown (03/12/2023)   Exercise Vital Sign    Days of Exercise per Week: 0 days    Minutes of Exercise per Session: Not on file  Stress: Stress Concern Present (03/12/2023)   [employer] of Occupational Health - Occupational Stress Questionnaire    Feeling of Stress : To some extent  Social Connections: Moderately Isolated (03/12/2023)   Social Connection and Isolation Panel [NHANES]    Frequency of Communication with Friends and Family: More than three times a week    Frequency of Social Gatherings with Friends and Family: Once a week    Attends Religious Services: More than 4 times per year    Active Member of [employer] or Organizations: No    Attends Engineer, structural: Not on file    Marital Status: Divorced  Catering manager Violence: Not on file   Family Status  Relation Name Status   Mother  Alive   Father  Alive   MGF  (Not Specified)   Other  (Not Specified)  No partnership data on file   Family History  Problem Relation Age of Onset   Heart attack Maternal Grandfather    Colon cancer Other    Lupus Mother    Arthritis Mother    Hyperlipidemia Mother    Diabetes Father    Hyperlipidemia Father    Hypertension Father    No Known Allergies    Review of Systems  Constitutional:  Negative for fever and malaise/fatigue.  HENT:  Negative for congestion.   Eyes:  Negative for blurred vision.  Respiratory:  Negative for cough and shortness of breath.   Cardiovascular:  Negative for chest pain, palpitations and leg swelling.  Gastrointestinal:   Negative for abdominal pain, blood in stool, nausea and vomiting.  Genitourinary:  Negative for dysuria and frequency.  Musculoskeletal:  Negative for back pain and falls.  Skin:  Negative for rash.  Neurological:  Negative for dizziness, loss of consciousness and headaches.  Endo/Heme/Allergies:  Negative for environmental allergies.  Psychiatric/Behavioral:  Negative for depression. The patient is not nervous/anxious.       Objective:     BP (!) 120/90 (BP Location: Right Arm, Patient Position: Sitting, Cuff Size: Large)   Pulse (!) 104   Temp 99 F (37.2 C) (Oral)   Resp 18   Ht 5\' 5"  (1.651 m)   Wt 226 lb 3.2  oz (102.6 kg)   SpO2 99%   BMI 37.64 kg/m  BP Readings from Last 3 Encounters:  03/12/23 (!) 120/90  04/25/22 (!) 141/98  12/16/21 120/84   Wt Readings from Last 3 Encounters:  03/12/23 226 lb 3.2 oz (102.6 kg)  12/16/21 229 lb 6.4 oz (104.1 kg)  10/22/20 242 lb 12.8 oz (110.1 kg)   SpO2 Readings from Last 3 Encounters:  03/12/23 99%  12/16/21 99%  10/22/20 99%      Physical Exam Vitals and nursing note reviewed.  Constitutional:      General: She is not in acute distress.    Appearance: Normal appearance. She is well-developed.  HENT:     Head: Normocephalic and atraumatic.  Eyes:     General: No scleral icterus.       Right eye: No discharge.        Left eye: No discharge.  Cardiovascular:     Rate and Rhythm: Normal rate and regular rhythm.     Heart sounds: No murmur heard. Pulmonary:     Effort: Pulmonary effort is normal. No respiratory distress.     Breath sounds: Normal breath sounds.  Musculoskeletal:        General: Tenderness present.     Cervical back: Normal range of motion and neck supple.     Right hip: Normal.     Left hip: Tenderness present. Decreased range of motion. Decreased strength.     Right knee: Swelling and effusion present. Decreased range of motion. Tenderness present.     Right lower leg: No edema.     Left lower  leg: No edema.  Skin:    General: Skin is warm and dry.  Neurological:     Mental Status: She is alert and oriented to person, place, and time.  Psychiatric:        Mood and Affect: Mood normal.        Behavior: Behavior normal.        Thought Content: Thought content normal.        Judgment: Judgment normal.      No results found for any visits on 03/12/23.  Last CBC Lab Results  Component Value Date   WBC 5.9 12/16/2021   HGB 13.7 12/16/2021   HCT 41.3 12/16/2021   MCV 87.9 12/16/2021   MCH 27.6 09/26/2019   RDW 13.4 12/16/2021   PLT 244.0 12/16/2021   Last metabolic panel Lab Results  Component Value Date   GLUCOSE 86 12/16/2021   NA 139 12/16/2021   K 4.4 12/16/2021   CL 102 12/16/2021   CO2 30 12/16/2021   BUN 12 12/16/2021   CREATININE 0.86 12/16/2021   GFR 77.52 12/16/2021   CALCIUM 9.2 12/16/2021   PROT 6.9 12/16/2021   ALBUMIN 4.0 12/16/2021   BILITOT 0.6 12/16/2021   ALKPHOS 59 12/16/2021   AST 23 12/16/2021   ALT 25 12/16/2021   Last lipids Lab Results  Component Value Date   CHOL 161 12/16/2021   HDL 59.10 12/16/2021   LDLCALC 91 12/16/2021   TRIG 55.0 12/16/2021   CHOLHDL 3 12/16/2021   Last hemoglobin A1c No results found for: "HGBA1C" Last thyroid functions Lab Results  Component Value Date   TSH 2.06 12/16/2021   T4TOTAL 6.8 12/10/2015   Last vitamin D No results found for: "25OHVITD2", "25OHVITD3", "VD25OH" Last vitamin B12 and Folate Lab Results  Component Value Date   VITAMINB12 538 06/16/2010   FOLATE 24.5 06/16/2010  The 10-year ASCVD risk score (Arnett DK, et al., 2019) is: 1.4%    Assessment & Plan:   Problem List Items Addressed This Visit       Unprioritized   Panic disorder with agoraphobia - Primary   Relevant Orders   Nortriptyline Level   Essential hypertension   Relevant Medications   metoprolol succinate (TOPROL-XL) 50 MG 24 hr tablet   hydrochlorothiazide (HYDRODIURIL) 25 MG tablet   Other  Relevant Orders   CBC with Differential/Platelet   Comprehensive metabolic panel   Lipid panel   Other Visit Diagnoses     Chronic pain of right knee       Relevant Orders   DG Knee Complete 4 Views Right   Left hip pain       Relevant Orders   DG Hip Unilat W OR W/O Pelvis 2-3 Views Left   DG Lumbar Spine Complete   Localized edema       Relevant Medications   hydrochlorothiazide (HYDRODIURIL) 25 MG tablet     Assessment and Plan    Chronic Knee Pain   She has chronic knee pain due to a torn meniscus diagnosed three years ago, with limited relief from wearing a brace and using ibuprofen and acetaminophen. She has not undergone physical therapy and experiences swelling and fluid accumulation, especially at day's end. We will order a knee x-ray and consider a referral to an orthopedic specialist in [place]. Discussion included the potential for a knee injection if the x-ray shows significant issues, although surgery's benefits remain uncertain.  Hip Pain   Her left hip pain, likely stemming from an altered gait due to chronic knee pain, is severe and sometimes radiates down the leg, worsening with certain movements. She struggles with activities like getting into a car and walking on uneven surfaces. We will order a hip x-ray and consider a referral to an orthopedic specialist in [place]. A hip injection may be discussed if the x-ray reveals significant issues, and the possibility of related back issues was noted, though she denies having back pain.  Peripheral Edema   She experiences swelling from the knees down, worsening at the end of the workday, with significant swelling and sock indentations noted. This is attributed to her sedentary job, with no other systemic illness symptoms reported. We will prescribe hydrochlorothiazide, check electrolytes, and advise consuming potassium-rich foods daily. A follow-up in 2-3 weeks will assess swelling and adjust treatment as  necessary.  Hypertension   Her hypertension is managed with metoprolol, which requires a refill.  General Health Maintenance   She completed a Cologuard test for colorectal cancer screening, valid for three years, requiring no immediate action.  Follow-up   A follow-up appointment in 2-3 weeks is scheduled to review swelling and lab results. She is advised to contact the office if x-ray results are delayed or if symptoms worsen.        Return in about 3 weeks (around 04/02/2023), or if symptoms worsen or fail to improve, for edema.    Donato Schultz, DO

## 2023-03-12 NOTE — Assessment & Plan Note (Signed)
Ghm utd Check labs  See AVS  Health Maintenance  Topic Date Due   HIV Screening  Never done   DTaP/Tdap/Td (2 - Tdap) 06/29/2011   Colonoscopy  Never done   Cervical Cancer Screening (HPV/Pap Cotest)  11/26/2018   Zoster Vaccines- Shingrix (1 of 2) Never done   COVID-19 Vaccine (1 - 2023-24 season) Never done   MAMMOGRAM  10/20/2023   INFLUENZA VACCINE  Completed   Hepatitis C Screening  Completed   HPV VACCINES  Aged Out

## 2023-03-12 NOTE — Patient Instructions (Signed)
Edema  Edema is an abnormal buildup of fluids in the body tissues and under the skin. Swelling of the legs, feet, and ankles is a common symptom that becomes more likely as you get older. Swelling is also common in looser tissues, such as around the eyes. Pressing on the area may make a temporary dent in your skin (pitting edema). This fluid may also accumulate in your lungs (pulmonary edema). There are many possible causes of edema. Eating too much salt (sodium) and being on your feet or sitting for a long time can cause edema in your legs, feet, and ankles. Common causes of edema include: Certain medical conditions, such as heart failure, liver or kidney disease, and cancer. Weak leg blood vessels. An injury. Pregnancy. Medicines. Being obese. Low protein levels in the blood. Hot weather may make edema worse. Edema is usually painless. Your skin may look swollen or shiny. Follow these instructions at home: Medicines Take over-the-counter and prescription medicines only as told by your health care provider. Your health care provider may prescribe a medicine to help your body get rid of extra water (diuretic). Take this medicine if you are told to take it. Eating and drinking Eat a low-salt (low-sodium) diet to reduce fluid as told by your health care provider. Sometimes, eating less salt may reduce swelling. Depending on the cause of your swelling, you may need to limit how much fluid you drink (fluid restriction). General instructions Raise (elevate) the injured area above the level of your heart while you are sitting or lying down. Do not sit still or stand for long periods of time. Do not wear tight clothing. Do not wear garters on your upper legs. Exercise your legs to get your circulation going. This helps to move the fluid back into your blood vessels, and it may help the swelling go down. Wear compression stockings as told by your health care provider. These stockings help to prevent  blood clots and reduce swelling in your legs. It is important that these are the correct size. These stockings should be prescribed by your health care provider to prevent possible injuries. If elastic bandages or wraps are recommended, use them as told by your health care provider. Contact a health care provider if: Your edema does not get better with treatment. You have heart, liver, or kidney disease and have symptoms of edema. You have sudden and unexplained weight gain. Get help right away if: You develop shortness of breath or chest pain. You cannot breathe when you lie down. You develop pain, redness, or warmth in the swollen areas. You have heart, liver, or kidney disease and suddenly get edema. You have a fever and your symptoms suddenly get worse. These symptoms may be an emergency. Get help right away. Call 911. Do not wait to see if the symptoms will go away. Do not drive yourself to the hospital. Summary Edema is an abnormal buildup of fluids in the body tissues and under the skin. Eating too much salt (sodium)and being on your feet or sitting for a long time can cause edema in your legs, feet, and ankles. Raise (elevate) the injured area above the level of your heart while you are sitting or lying down. Follow your health care provider's instructions about diet and how much fluid you can drink. This information is not intended to replace advice given to you by your health care provider. Make sure you discuss any questions you have with your health care provider. Document Revised: 12/06/2020 Document  Reviewed: 12/06/2020 Elsevier Patient Education  2024 [employer].

## 2023-03-13 NOTE — Addendum Note (Signed)
Addended by: Seabron Spates R on: 03/13/2023 12:41 PM   Modules accepted: Orders

## 2023-03-16 LAB — NORTRIPTYLINE LEVEL: Nortriptyline Lvl: 101 ug/L (ref 50–150)

## 2023-03-26 ENCOUNTER — Other Ambulatory Visit: Payer: Self-pay

## 2023-03-26 DIAGNOSIS — M25552 Pain in left hip: Secondary | ICD-10-CM

## 2023-03-28 ENCOUNTER — Encounter: Payer: Self-pay | Admitting: Family Medicine

## 2023-03-28 ENCOUNTER — Other Ambulatory Visit: Payer: Self-pay | Admitting: Family Medicine

## 2023-03-28 DIAGNOSIS — G8929 Other chronic pain: Secondary | ICD-10-CM

## 2023-03-28 DIAGNOSIS — M25552 Pain in left hip: Secondary | ICD-10-CM

## 2023-04-03 ENCOUNTER — Other Ambulatory Visit: Payer: Self-pay | Admitting: Psychiatry

## 2023-04-26 ENCOUNTER — Ambulatory Visit: Payer: No Typology Code available for payment source | Admitting: Psychiatry

## 2023-05-08 ENCOUNTER — Other Ambulatory Visit: Payer: Self-pay | Admitting: Psychiatry

## 2023-05-08 DIAGNOSIS — F4001 Agoraphobia with panic disorder: Secondary | ICD-10-CM

## 2023-05-15 ENCOUNTER — Other Ambulatory Visit: Payer: Self-pay | Admitting: Psychiatry

## 2023-05-15 NOTE — Telephone Encounter (Signed)
Lf 09/19 90 days; lv 1/9/ 24; ns 10/18;  pt canceled 1/9/ 25

## 2023-05-28 ENCOUNTER — Other Ambulatory Visit: Payer: Self-pay | Admitting: Psychiatry

## 2023-05-28 DIAGNOSIS — F5105 Insomnia due to other mental disorder: Secondary | ICD-10-CM

## 2023-05-28 DIAGNOSIS — F4001 Agoraphobia with panic disorder: Secondary | ICD-10-CM

## 2023-06-05 ENCOUNTER — Ambulatory Visit: Payer: No Typology Code available for payment source | Admitting: Psychiatry

## 2023-06-05 ENCOUNTER — Encounter: Payer: Self-pay | Admitting: Psychiatry

## 2023-06-05 DIAGNOSIS — F5105 Insomnia due to other mental disorder: Secondary | ICD-10-CM

## 2023-06-05 DIAGNOSIS — F900 Attention-deficit hyperactivity disorder, predominantly inattentive type: Secondary | ICD-10-CM | POA: Diagnosis not present

## 2023-06-05 DIAGNOSIS — F4001 Agoraphobia with panic disorder: Secondary | ICD-10-CM

## 2023-06-05 MED ORDER — NORTRIPTYLINE HCL 50 MG PO CAPS: 100.0000 mg | ORAL_CAPSULE | Freq: Every day | ORAL | 3 refills | Status: AC

## 2023-06-05 MED ORDER — CLONAZEPAM 0.5 MG PO TABS: 0.7500 mg | ORAL_TABLET | Freq: Every day | ORAL | 1 refills | Status: DC

## 2023-06-05 MED ORDER — LISDEXAMFETAMINE DIMESYLATE 30 MG PO CAPS: 30.0000 mg | ORAL_CAPSULE | Freq: Every morning | ORAL | 0 refills | Status: DC

## 2023-06-05 NOTE — Progress Notes (Signed)
Alexis Zamora [id number] March 28, 1971 53 y.o.  Subjective:   Patient ID:  Alexis Zamora is a 53 y.o. (DOB Apr 21, 1970) female.  Chief Complaint:  Chief Complaint  Patient presents with   Follow-up    HPI   Kimball B Mcelreath presents to the office today for follow-up of panic and ADD.  seen June 2020.  Vyvanse was reduced at her request from 40 to 30 mg daily.  Her anxiety was higher than it had been.  Been on nortriptyline 125 for years and clonazepam 0.5 mg HS prn.  White coat syndrome with BP.  seen December 2020.  No meds were changed.  09/25/2019 appointment the following is noted: More often lorazepam lately twice weekly. BP is higher lately and starting meds.   Panic last week at work DT work stress. Anxiety is up worrying about BP just the last couple of weeks. Some nights can't sleep DT heart racing and worry over BP.  Rarely used lorazepam for sleep.   Chronic stress with job and older D on and off drugs and younger D using pot. Only caffeine AM.   Focus ok with reduction in Vyvanse.  Patient reports stable mood and denies depressed or irritable moods.  Denies appetite disturbance.  Patient reports that energy and motivation have been good.  Patient denies any difficulty with concentration.   Patient denies any suicidal ideation. Vyvanse works well for attention and focus.  12 hour shifts and constant volume.  Added stress and white coat syndrome and worries over BP even when sh checks it.  PLAN: No med changes  02/12/2020 appointment with the following noted: Rare lorazepam. Added metoprolol which helped white coat hypertension and she's pleased.  Once she knew her BP was ok then her anxiety and sleep got better.   Vyvanse working with good duration. 1 panic since here.  Not depressed. Patient reports stable mood and denies depressed or irritable moods.  Patient denies any recent difficulty with anxiety.  Patient denies difficulty with sleep initiation or maintenance. Denies  appetite disturbance.  Patient reports that energy and motivation have been good.  Patient denies any difficulty with concentration.  Patient denies any suicidal ideation. No SE concerns with meds. Plan:Continue nortriptyline 125 mg daily which she has been on for years for panic disorder Continue clonazepam 0.25 to 0.5 mg nightly for sleep.   Continue lorazepam 0.5 mg as needed panic this is used rarely. No med changes today are indicated Vyvanse 30  10/07/20 appt noted: Rare lorazepam. Doiing well.  Busy.  No panic since here. Patient reports stable mood and denies depressed or irritable moods.  Patient denies any recent difficulty with anxiety.  Patient denies difficulty with sleep initiation or maintenance. Denies appetite disturbance.  Patient reports that energy and motivation have been good.  Patient denies any difficulty with concentration.  Patient denies any suicidal ideation. No SE with meds. Checks BP daily and has been fine at home. Asks about reducing nortriptyline.  I want to try it. Plan: OK trial reduction in nortriptyline to 100 mg HS to get to LED for panic. Continue clonazepam 0.25 to 0.5 mg nightly for sleep.   Continue lorazepam 0.5 mg as needed panic this is used rarely. Continue Vyvanse 30 mg a.m.  07/18/2021 appointment with the following noted: Reduced nortriptyline to 100 mg HS. More stressed out than ever been in life. ExH died [place] at 53 yo.  Been apart many years, but it's been hard on her D's bc  stressed relationship.  She got named executor and she' having to go almost daily to his house to get it ready.  He had no money left.    Been really busy. Continue meds. Job stressful.  Hasn't taken time off.  M dementia.   D's 13, 55 yo.  04/25/21 appt noted: Continues meds as above. No lorazepam used. Real busy with work. Ex H died early last year and named her as executor with no money and debt. She hopes it will end soon. No panic.  Anxiety is understandable  and functions well.  Not enough sleep at times bc stays up too late. Generic vyvanse same effect with always lull about 3 pm.  Not worth thrying to increase dose. No napping.   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  06/05/23 appt noted: Med: clonazepam 0.5 mg HS, vyvanse 30 AM , nortriptyline 100 HS.  Mental health is not as good DT painful hip.  In workup.  She is potential candidate for hip replacment but trying to put it off. Overall satisfied with meds and doesn't want more or having to change.  Just something I have to go through. No SE Still good with Vyvanse.  Fine with clonazepam and it helps.  Sleep is not a problem. No recent panic.     Past Psychiatric Medication Trials:  Nortriptyline 125, duloxetine anxiety Vyvanse 40, Ritalin, clonazepam at bedtime,   Under our care since 2010 and on nortriptyline and clonazepam since then  Review of Systems:  Review of Systems  Cardiovascular:  Negative for chest pain and palpitations.  Neurological:  Negative for tremors and weakness.  Psychiatric/Behavioral:  Negative for agitation, behavioral problems, confusion, decreased concentration, dysphoric mood, hallucinations, self-injury, sleep disturbance and suicidal ideas. The patient is nervous/anxious. The patient is not hyperactive.     Medications: I have reviewed the patient's current medications.  Current Outpatient Medications  Medication Sig Dispense Refill   aspirin 81 MG tablet Take 81 mg by mouth daily.       meloxicam (MOBIC) 15 MG tablet Take 1 tablet by mouth daily.     metoprolol succinate (TOPROL-XL) 50 MG 24 hr tablet Take 1 tablet (50 mg total) by mouth daily. Take with or immediately following a meal. Pt needs office visit for further refills 90 tablet 3   clonazePAM (KLONOPIN) 0.5 MG tablet Take 1.5 tablets (0.75 mg total) by mouth at bedtime. 135 tablet 1   hydrochlorothiazide (HYDRODIURIL) 25 MG tablet Take 1 tablet (25 mg total) by mouth daily. (Patient not taking:  Reported on 06/05/2023) 90 tablet 3   lisdexamfetamine (VYVANSE) 30 MG capsule Take 1 capsule (30 mg total) by mouth every morning. 90 capsule 0   nortriptyline (PAMELOR) 50 MG capsule Take 2 capsules (100 mg total) by mouth at bedtime. 180 capsule 3   No current facility-administered medications for this visit.    Medication Side Effects: None  Allergies: No Known Allergies  Past Medical History:  Diagnosis Date   Anxiety    Carpal tunnel syndrome    GERD (gastroesophageal reflux disease)    Migraine     Family History  Problem Relation Age of Onset   Heart attack Maternal Grandfather    Colon cancer Other    Lupus Mother    Arthritis Mother    Hyperlipidemia Mother    Diabetes Father    Hyperlipidemia Father    Hypertension Father     Social History   Socioeconomic History   Marital status: Divorced  Spouse name: Not on file   Number of children: Not on file   Years of education: Not on file   Highest education level: Some college, no degree  Occupational History   Occupation: ER--unit Producer, television/film/video: [employer]  Tobacco Use   Smoking status: Former    Current packs/day: 0.00    Average packs/day: 1.5 packs/day for 18.0 years (27.0 ttl pk-yrs)    Types: Cigarettes    Start date: 09/20/1985    Quit date: 09/21/2003    Years since quitting: 19.7   Smokeless tobacco: Never  Substance and Sexual Activity   Alcohol use: No   Drug use: No   Sexual activity: Not Currently    Partners: Male  Other Topics Concern   Not on file  Social History Narrative   Exercise-- no   Social Drivers of Health   Financial Resource Strain: High Risk (03/12/2023)   Overall Financial Resource Strain (CARDIA)    Difficulty of Paying Living Expenses: Very hard  Food Insecurity: No Food Insecurity (03/12/2023)   Hunger Vital Sign    Worried About Running Out of Food in the Last Year: Never true    Ran Out of Food in the Last Year: Never true   Transportation Needs: No Transportation Needs (03/12/2023)   PRAPARE - Administrator, Civil Service (Medical): No    Lack of Transportation (Non-Medical): No  Physical Activity: Unknown (03/12/2023)   Exercise Vital Sign    Days of Exercise per Week: 0 days    Minutes of Exercise per Session: Not on file  Stress: Stress Concern Present (03/12/2023)   [employer] of Occupational Health - Occupational Stress Questionnaire    Feeling of Stress : To some extent  Social Connections: Moderately Isolated (03/12/2023)   Social Connection and Isolation Panel [NHANES]    Frequency of Communication with Friends and Family: More than three times a week    Frequency of Social Gatherings with Friends and Family: Once a week    Attends Religious Services: More than 4 times per year    Active Member of [employer] or Organizations: No    Attends Engineer, structural: Not on file    Marital Status: Divorced  Intimate Partner Violence: Not on file    Past Medical History, Surgical history, Social history, and Family history were reviewed and updated as appropriate.   Please see review of systems for further details on the patient's review from today.   Objective:   Physical Exam:  There were no vitals taken for this visit.  Physical Exam Constitutional:      General: She is not in acute distress. Musculoskeletal:        General: No deformity.  Neurological:     Mental Status: She is alert and oriented to person, place, and time.     Coordination: Coordination normal.     Gait: Gait normal.  Psychiatric:        Attention and Perception: Attention and perception normal.        Mood and Affect: Mood is not anxious or depressed. Affect is not labile, blunt, angry, tearful or inappropriate.        Speech: Speech normal. Speech is not slurred.        Behavior: Behavior normal.        Thought Content: Thought content normal. Thought content is not delusional. Thought  content does not include homicidal or suicidal ideation. Thought content does not include  suicidal plan.        Cognition and Memory: Cognition normal.        Judgment: Judgment normal.     Comments: Insight intact. No auditory or visual hallucinations. No delusions.       Lab Review:     Component Value Date/Time   NA 142 03/12/2023 0949   K 4.7 03/12/2023 0949   CL 104 03/12/2023 0949   CO2 29 03/12/2023 0949   GLUCOSE 93 03/12/2023 0949   BUN 12 03/12/2023 0949   CREATININE 0.85 03/12/2023 0949   CREATININE 0.91 09/26/2019 1559   CALCIUM 9.3 03/12/2023 0949   PROT 6.8 03/12/2023 0949   ALBUMIN 4.2 03/12/2023 0949   AST 17 03/12/2023 0949   ALT 17 03/12/2023 0949   ALKPHOS 57 03/12/2023 0949   BILITOT 0.5 03/12/2023 0949   GFRNONAA 105.17 09/30/2009 1556   GFRAA 90 06/18/2008 0940       Component Value Date/Time   WBC 5.9 03/12/2023 0949   RBC 4.71 03/12/2023 0949   HGB 13.8 03/12/2023 0949   HCT 41.8 03/12/2023 0949   PLT 255.0 03/12/2023 0949   MCV 88.7 03/12/2023 0949   MCH 27.6 09/26/2019 1559   MCHC 32.9 03/12/2023 0949   RDW 13.5 03/12/2023 0949   LYMPHSABS 1.4 03/12/2023 0949   MONOABS 0.7 03/12/2023 0949   EOSABS 0.1 03/12/2023 0949   BASOSABS 0.1 03/12/2023 0949    No results found for: "POCLITH", "LITHIUM"   No results found for: "PHENYTOIN", "PHENOBARB", "VALPROATE", "CBMZ"   .res Assessment: Plan:    Lyrical was seen today for follow-up.  Diagnoses and all orders for this visit:  Panic disorder with agoraphobia -     clonazePAM (KLONOPIN) 0.5 MG tablet; Take 1.5 tablets (0.75 mg total) by mouth at bedtime. -     nortriptyline (PAMELOR) 50 MG capsule; Take 2 capsules (100 mg total) by mouth at bedtime.  Attention deficit hyperactivity disorder (ADHD), predominantly inattentive type -     lisdexamfetamine (VYVANSE) 30 MG capsule; Take 1 capsule (30 mg total) by mouth every morning.  Insomnia due to mental condition -     clonazePAM  (KLONOPIN) 0.5 MG tablet; Take 1.5 tablets (0.75 mg total) by mouth at bedtime.    Greater than 50% of 30 min face to face time with patient was spent on counseling and coordination of care. We discussed the following:  The addition of the beta-blocker there are a lot to help her anxiety because addressing the white coat syndrome hypertension helped to reduce her anxiety significantly.  She became less worried over her heart and her health and less prone to panic.   She is very pleased with the meds overall and is tolerating meds well.  OK with reduction in nortriptyline to 100 mg HS to get to LED for panic. Discussed the risk of reducing nortriptyline to the level it no longer prevents panic.  However her anxiety is better since the beta-blocker was added in she may be able to get by with less nortriptyline.  Satisfied with meds  Vyvanse 30 mg. BC of additonal stress and anxiety and worry over BP .  Nothing likely to improve if dose is reduced.  Wonders about increase but doesn't want to do so.  Using discount card.  Discussed potential benefits, risks, and side effects of stimulants with patient to include increased heart rate, palpitations, insomnia, increased anxiety, increased irritability, or decreased appetite.  Instructed patient to contact office if experiencing any significant tolerability  issues.  Disc BP concerns with stimulants in detail.  Disc BP issues and her's bein up  at MD offices anf fine at home.  Continue clonazepam 0.25 to 0.5 mg nightly for sleep.   Continue lorazepam 0.5 mg as needed panic this is used rarely. No med changes today are indicated Would suggest nortriptyline level by age 64 just to make sure it's not creeping up DT greater risks with age.d  Disc potential for blood pressure meds and choices.  All other things being equal a beta blocker would be a good choice bc of potential benefit for anxiety and the tendency  The addition of the beta-blocker also helped  her to have less anxiety around sleep and she is able to sleep with clonazepam 0.5 mg nightly  We discussed the short-term risks associated with benzodiazepines including sedation and increased fall risk among others.  Discussed long-term side effect risk including dependence, potential withdrawal symptoms, and the potential eventual dose-related risk of dementia.  But recent studies from 2020 dispute this association between benzodiazepines and dementia risk. Newer studies in 2020 do not support an association with dementia.  As much self care as possible  Disc possible mail order to ease compliance.  FU 12 mos  Meredith Staggers, MD, DFAPA    Please see After Visit Summary for patient specific instructions.  No future appointments.   No orders of the defined types were placed in this encounter.     -------------------------------

## 2023-10-17 ENCOUNTER — Other Ambulatory Visit: Payer: Self-pay | Admitting: Psychiatry

## 2023-10-17 DIAGNOSIS — F900 Attention-deficit hyperactivity disorder, predominantly inattentive type: Secondary | ICD-10-CM

## 2024-02-04 ENCOUNTER — Other Ambulatory Visit: Payer: Self-pay | Admitting: Psychiatry

## 2024-02-04 DIAGNOSIS — F900 Attention-deficit hyperactivity disorder, predominantly inattentive type: Secondary | ICD-10-CM

## 2024-02-18 ENCOUNTER — Encounter: Payer: Self-pay | Admitting: Radiology

## 2024-03-09 ENCOUNTER — Other Ambulatory Visit: Payer: Self-pay | Admitting: Family Medicine

## 2024-03-09 DIAGNOSIS — I10 Essential (primary) hypertension: Secondary | ICD-10-CM

## 2024-04-08 LAB — HM MAMMOGRAPHY

## 2024-04-11 ENCOUNTER — Encounter: Payer: Self-pay | Admitting: Family Medicine

## 2024-04-13 ENCOUNTER — Other Ambulatory Visit: Payer: Self-pay | Admitting: Family Medicine

## 2024-04-13 DIAGNOSIS — I10 Essential (primary) hypertension: Secondary | ICD-10-CM

## 2024-05-08 ENCOUNTER — Other Ambulatory Visit: Payer: Self-pay | Admitting: Psychiatry

## 2024-05-08 DIAGNOSIS — F5105 Insomnia due to other mental disorder: Secondary | ICD-10-CM

## 2024-05-08 DIAGNOSIS — F4001 Agoraphobia with panic disorder: Secondary | ICD-10-CM

## 2024-05-13 ENCOUNTER — Other Ambulatory Visit: Payer: Self-pay | Admitting: Family Medicine

## 2024-05-13 DIAGNOSIS — I10 Essential (primary) hypertension: Secondary | ICD-10-CM

## 2024-05-19 ENCOUNTER — Other Ambulatory Visit: Payer: Self-pay | Admitting: Psychiatry

## 2024-05-19 DIAGNOSIS — F900 Attention-deficit hyperactivity disorder, predominantly inattentive type: Secondary | ICD-10-CM

## 2024-06-03 ENCOUNTER — Ambulatory Visit: Payer: No Typology Code available for payment source | Admitting: Psychiatry
